# Patient Record
Sex: Female | Born: 1980 | Race: White | Hispanic: No | Marital: Married | Smoking: Never smoker
Health system: Southern US, Community
[De-identification: ages and names within clinical notes are randomized; demographics above are authoritative.]

## PROBLEM LIST (undated history)

## (undated) DIAGNOSIS — E079 Disorder of thyroid, unspecified: Secondary | ICD-10-CM

## (undated) DIAGNOSIS — N63 Unspecified lump in unspecified breast: Secondary | ICD-10-CM

## (undated) DIAGNOSIS — F32A Depression, unspecified: Secondary | ICD-10-CM

## (undated) DIAGNOSIS — Z309 Encounter for contraceptive management, unspecified: Secondary | ICD-10-CM

## (undated) DIAGNOSIS — I10 Essential (primary) hypertension: Secondary | ICD-10-CM

## (undated) DIAGNOSIS — N644 Mastodynia: Secondary | ICD-10-CM

## (undated) DIAGNOSIS — F329 Major depressive disorder, single episode, unspecified: Secondary | ICD-10-CM

## (undated) HISTORY — DX: Unspecified lump in unspecified breast: N63.0

## (undated) HISTORY — DX: Depression, unspecified: F32.A

## (undated) HISTORY — DX: Mastodynia: N64.4

## (undated) HISTORY — DX: Essential (primary) hypertension: I10

## (undated) HISTORY — DX: Disorder of thyroid, unspecified: E07.9

## (undated) HISTORY — DX: Encounter for contraceptive management, unspecified: Z30.9

## (undated) HISTORY — DX: Major depressive disorder, single episode, unspecified: F32.9

---
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
## 2003-02-11 ENCOUNTER — Emergency Department (HOSPITAL_COMMUNITY): Admission: EM | Admit: 2003-02-11 | Discharge: 2003-02-11 | Payer: Self-pay | Admitting: Emergency Medicine

## 2003-07-12 ENCOUNTER — Emergency Department (HOSPITAL_COMMUNITY): Admission: EM | Admit: 2003-07-12 | Discharge: 2003-07-13 | Payer: Self-pay | Admitting: Emergency Medicine

## 2003-09-25 ENCOUNTER — Emergency Department (HOSPITAL_COMMUNITY): Admission: EM | Admit: 2003-09-25 | Discharge: 2003-09-25 | Payer: Self-pay | Admitting: Emergency Medicine

## 2007-04-13 ENCOUNTER — Emergency Department (HOSPITAL_COMMUNITY): Admission: EM | Admit: 2007-04-13 | Discharge: 2007-04-13 | Payer: Self-pay | Admitting: Emergency Medicine

## 2007-04-17 ENCOUNTER — Ambulatory Visit: Payer: Self-pay | Admitting: Gastroenterology

## 2010-06-01 NOTE — Assessment & Plan Note (Signed)
Deborah Vaughn, [name]                CHART#:  [id number]   DATE:  04/17/2007                       DOB:  [birth year]   REFERRING PHYSICIAN:  Donnetta Hutching, MD   REASON FOR CONSULTATION:  Rectal bleeding.   HISTORY OF PRESENT ILLNESS:  [name] is a 30 year old female who has  difficulty having a bowel movement.  She may have a bowel movement once  every three days and always has to strain.  Her last menstrual period  was approximately three weeks ago.  She was seen in the emergency  department on April 13, 2007 for rectal bleeding. The physician  reportedly told her she had a mass and needed to see a  gastroenterologist.  He also gave her some Anusol HC suppositories, #20  and asked her to use one twice daily until they are complete.  She says  this all began when her stools became hard and large.  She used stool  softeners as well as fiber pills. She has had an intentional 30 pound  weight loss.  Every time she has a bowel movement, she sees tons of  blood and it is very painful.  It is a ripping tearing pain that she  feels in her rectum and she does not really want to have a bowel  movement.  She has had some lower abdominal discomfort associated with  these symptoms but denies any diarrhea.  The stool softeners or the  suppositories make her go to the bathroom more frequently.  She usually  has a hard to soft stool.  She has had no sores in her mouth, no pain  with swallowing or any rashes on her shins.  Her heartburn and her  indigestion area treated with Zantac.  She has those symptoms every few  days.   PAST MEDICAL HISTORY:  Hypertension.   PAST SURGICAL HISTORY:  None.   ALLERGIES:  VICODIN causes throat swelling and itching.   MEDICATIONS:  1. Lo-Ovral 28 daily for the last six years.  2. Lisinopril/HCTZ 20/12.5 mg daily.  3. Hydrocortisone AC 25 mg suppositories rectally bid since Friday or      Saturday.   FAMILY HISTORY:  Her father had colon cancer at age 65.  She  has no  family history of ulcerative colitis or Crohn's disease.  She has one  sister that is healthy.   SOCIAL HISTORY:  She is not married.  She works as a Engineer, mining in  [place].  She exercises every day on the elliptical.  Sometimes she  does the core ball.  She does not smoke or drink alcohol.   REVIEW OF SYSTEMS:  As per the HPI.  Otherwise all systems are negative.   PHYSICAL EXAMINATION:  VITAL SIGNS:  Weight 217 pounds, height 5 feet 4  inches, temperature 98.2, blood pressure 110/74, pulse 72.  GENERAL:  She is in no acute distress, alert and oriented x4.  HEENT:  Atraumatic, normocephalic.  Pupils equal and reactive to light. Mouth:  No oral lesions. Posterior pharynx without erythema or exudate.  NECK:  Full range of motion, no lymphadenopathy.LUNGS:  Clear to auscultation  bilaterally.CARDIOVASCULAR:  Regular rhythm, no murmur.  ABDOMEN:  Bowel  sounds are present, soft, nontender, nondistended, no rebound or  guarding.EXTREMITIES:  Without cyanosis or edema.  NEUROLOGIC:  She has no  focal neurologic deficits.  SKIN:  She has no  pretibial lesions.  RECTAL:  Digital exam extremely uncomfortable, pain  elicited with stressing the anal canal, small internal hemorrhoid on the  right at approximately 5 o'clock.  Limited rectal exam due to  discomfort, hemoccult positive (slightly).   ASSESSMENT:  [name] is a 30 year old female who has a first degree  relative with history of colon cancer.  Her symptoms sound typical for  anal fissure.  Thank you for allowing me to see Ms. Barnes in  consultation.  My recommendations follow.   RECOMMENDATIONS:  1. [name] declines colonoscopy at this point.  She would like to      try conservative management for her symptoms.  She is given her      discharge instructions in writing.  2. She is to avoid straining with bowel movements and avoid formed      stools for the next month.  She will add docusate sodium 100 mg       tablets three times daily.  If her stools still have form to them,      then she is to add Miralax once or twice a day.  3. She is to follow a low residue diet.  She is given a hand out on      low residue diet.  She is to drink six to eight cups of water      daily.  4. She should then complete the Anusol twice daily for 10 days.  If      she is still having rectal discomfort and bleeding, then she may      repeat another course, complete a 20-day therapy.  5. For additional relief from rectal discomfort, she may use glycerin      suppositories prior to having a bowel movement or she may use sitz      baths for 10 to 15 minutes after each bowel movement to relieve      rectal pain.  She also may try Tylenol, Aleve or ibuprofen.  6. She has follow-up appointment to see me in four weeks.  7. Will repeat a rectal exam when she returns in four weeks to have a      more accurate digital exam.       Kassie Mends, M.D.  Electronically Signed     SM/MEDQ  D:  04/17/2007  T:  04/17/2007  Job:  [id number]   cc:   Donnetta Hutching, MD

## 2014-04-28 ENCOUNTER — Ambulatory Visit: Payer: 59

## 2014-05-19 ENCOUNTER — Encounter: Payer: Self-pay | Admitting: Adult Health

## 2014-05-19 ENCOUNTER — Other Ambulatory Visit (HOSPITAL_COMMUNITY)
Admission: RE | Admit: 2014-05-19 | Discharge: 2014-05-19 | Disposition: A | Discharge status: Home / Self Care | Payer: Self-pay | Source: Ambulatory Visit | Attending: Adult Health | Admitting: Adult Health

## 2014-05-19 ENCOUNTER — Ambulatory Visit (INDEPENDENT_AMBULATORY_CARE_PROVIDER_SITE_OTHER): Payer: 59 | Admitting: Adult Health

## 2014-05-19 VITALS — BP 138/90 | HR 92 | Ht 63.5 in | Wt 182.5 lb

## 2014-05-19 DIAGNOSIS — Z30011 Encounter for initial prescription of contraceptive pills: Secondary | ICD-10-CM

## 2014-05-19 DIAGNOSIS — Z01419 Encounter for gynecological examination (general) (routine) without abnormal findings: Secondary | ICD-10-CM

## 2014-05-19 DIAGNOSIS — Z309 Encounter for contraceptive management, unspecified: Secondary | ICD-10-CM

## 2014-05-19 DIAGNOSIS — N644 Mastodynia: Secondary | ICD-10-CM

## 2014-05-19 DIAGNOSIS — N63 Unspecified lump in unspecified breast: Secondary | ICD-10-CM

## 2014-05-19 DIAGNOSIS — Z1151 Encounter for screening for human papillomavirus (HPV): Secondary | ICD-10-CM | POA: Insufficient documentation

## 2014-05-19 HISTORY — DX: Mastodynia: N64.4

## 2014-05-19 HISTORY — DX: Unspecified lump in unspecified breast: N63.0

## 2014-05-19 HISTORY — DX: Encounter for contraceptive management, unspecified: Z30.9

## 2014-05-19 MED ORDER — NORETHIN-ETH ESTRAD TRIPHASIC 0.5/0.75/1-35 MG-MCG PO TABS: 1.0000 | ORAL_TABLET | Freq: Every day | ORAL | Status: AC

## 2014-05-19 NOTE — Patient Instructions (Signed)
Breast Cyst A breast cyst is a sac in the breast that is filled with fluid. Breast cysts are common in women. Women can have one or many cysts. When the breasts contain many cysts, it is usually due to a noncancerous (benign) condition called fibrocystic change. These lumps form under the influence of female hormones (estrogen and progesterone). The lumps are most often located in the upper, outer portion of the breast. They are often more swollen, painful, and tender before your period starts. They usually disappear after menopause, unless you are on hormone therapy.  There are several types of cysts:  Macrocyst. This is a cyst that is about 2 in. (5.1 cm) in diameter.   Microcyst. This is a tiny cyst that you cannot feel but can be seen with a mammogram or an ultrasound.   Galactocele. This is a cyst containing milk that may develop if you suddenly stop breastfeeding.   Sebaceous cyst of the skin. This type of cyst is not in the breast tissue itself. Breast cysts do not increase your risk of breast cancer. However, they must be monitored closely because they can be cancerous.  CAUSES  It is not known exactly what causes a breast cyst to form. Possible causes include:  An overgrowth of milk glands and connective tissue in the breast can block the milk glands, causing them to fill with fluid.   Scar tissue in the breast from previous surgery may block the glands, causing a cyst.  RISK FACTORS Estrogen may influence the development of a breast cyst.  SIGNS AND SYMPTOMS   Feeling a smooth, round, soft lump (like a grape) in the breast that is easily moveable.   Breast discomfort or pain.  Increase in size of the lump before your menstrual period and decrease in its size after your menstrual period.  DIAGNOSIS  A cyst can be felt during a physical exam by your health care provider. A breast X-ray exam (mammogram) and ultrasonography will be done to confirm the diagnosis. Fluid may  be removed from the cyst with a needle (fine needle aspiration) to make sure the cyst is not cancerous.  TREATMENT  Treatment may not be necessary. Your health care provider may monitor the cyst to see if it goes away on its own. If treatment is needed, it may include:  Hormone treatment.   Needle aspiration. There is a chance of the cyst coming back after aspiration.   Surgery to remove the whole cyst.  HOME CARE INSTRUCTIONS   Keep all follow-up appointments with your health care provider.  See your health care provider regularly:  Get a yearly exam by your health care provider.  Have a clinical breast exam by a health care provider every 1-3 years if you are 20-40 years of age. After age 40 years, you should have the exam every year.   Get mammogram tests as directed by your health care provider.   Understand the normal appearance and feel of your breasts and perform breast self-exams.   Only take over-the-counter or prescription medicines as directed by your health care provider.   Wear a supportive bra, especially when exercising.   Avoid caffeine.   Reduce your salt intake, especially before your menstrual period. Too much salt can cause fluid retention, breast swelling, and discomfort.  SEEK MEDICAL CARE IF:   You feel, or think you feel, a lump in your breast.   You notice that both breasts look or feel different than usual.   Your   breast is still causing pain after your menstrual period is over.   You need medicine for breast pain and swelling that occurs with your menstrual period.  SEEK IMMEDIATE MEDICAL CARE IF:   You have severe pain, tenderness, redness, or warmth in your breast.   You have nipple discharge or bleeding.   Your breast lump becomes hard and painful.   You find new lumps or bumps that were not there before.   You feel lumps in your armpit (axilla).   You notice dimpling or wrinkling of the breast or nipple.   You  have a fever.  MAKE SURE YOU:  Understand these instructions.  Will watch your condition.  Will get help right away if you are not doing well or get worse. Document Released: 01/03/2005 Document Revised: 09/05/2012 Document Reviewed: 08/02/2012 [facility] Patient Information 2015 [employer], [place]. This information is not intended to replace advice given to you by your health care provider. Make sure you discuss any questions you have with your health care provider. Physical in 1 year Mammogram 5/24 at 1:45 pm be there at 1:30  Pap in 3 years if this normal with negative HPV

## 2014-05-19 NOTE — Progress Notes (Signed)
Patient ID: [name], female   DOB: 08/19/1980, 34 y.o.   MRN: [id number] History of Present Illness: Deborah Vaughn is a 34 year old white female,single in for a well woman gyn exam and pap.She complains of pain in left nipple area for 2 months and has taken an antibiotic from PCP. Her PCP is Dr Polly CobiaHasanji.   Current Medications, Allergies, Past Medical History, Past Surgical History, Family History and Social History were [facility].     Review of Systems: Patient denies any headaches, hearing loss, blurred vision, shortness of breath, chest pain, abdominal pain, problems with bowel movements, urination, or intercourse. No joint pain or mood swings. +left breast pain.Has been tired lately. She is happy with her OCs and needs refill, she has lost about 95 lb about 5 years ago and has gained about 15 back so has restarted phentermine.   Physical Exam:BP 138/90 mmHg  Pulse 92  Ht 5' 3.5" (1.613 m)  Wt 182 lb 8 oz (82.781 kg)  BMI 31.82 kg/m2  LMP 05/08/2014 General:  Well developed, well nourished, no acute distress Skin:  Warm and dry Neck:  Midline trachea, normal thyroid, good ROM, no lymphadenopathy Lungs; Clear to auscultation bilaterally Breast:  No dominant palpable mass, retraction, or nipple discharge on right, on left no retraction or nipple discharge but has pain at nipple and there is a thickness/?nodule at 3 o'clock that is tender Cardiovascular: Regular rate and rhythm Abdomen:  Soft, non tender, no hepatosplenomegaly Pelvic:  External genitalia is normal in appearance, no lesions.  The vagina is normal in appearance. Urethra has no lesions or masses. The cervix is everted at os, pap with HPV performed.  Uterus is felt to be normal size, shape, and contour.  No adnexal masses or tenderness noted.Bladder is non tender, no masses felt. Extremities/musculoskeletal:  No swelling or varicosities noted, no clubbing or cyanosis Psych:  No mood  changes, alert and cooperative,seems happy   Impression: Well woman gyn exam with pap Left breast pain ?nodule left breast Contraceptive management    Plan: Check CBC,CMP,TSH and lipids Bilateral diagnostic [facility] pirmella [phone] x 1 year Physical in 1 year, pap in 3 years if normal with negative HPV Review hand out on breast cyst Do not wear under wire for a while,try sports bra

## 2014-05-20 ENCOUNTER — Telehealth: Payer: Self-pay | Admitting: Adult Health

## 2014-05-20 LAB — COMPREHENSIVE METABOLIC PANEL
ALBUMIN: 4.3 g/dL (ref 3.5–5.5)
ALK PHOS: 54 IU/L (ref 39–117)
ALT: 14 IU/L (ref 0–32)
AST: 16 IU/L (ref 0–40)
Albumin/Globulin Ratio: 2 (ref 1.1–2.5)
BUN / CREAT RATIO: 10 (ref 8–20)
BUN: 11 mg/dL (ref 6–20)
Bilirubin Total: 0.2 mg/dL (ref 0.0–1.2)
CALCIUM: 9.1 mg/dL (ref 8.7–10.2)
CO2: 21 mmol/L (ref 18–29)
CREATININE: 1.07 mg/dL — AB (ref 0.57–1.00)
Chloride: 102 mmol/L (ref 97–108)
GFR calc Af Amer: 79 mL/min/{1.73_m2} (ref >59)
GFR calc non Af Amer: 69 mL/min/{1.73_m2} (ref >59)
GLOBULIN, TOTAL: 2.2 g/dL (ref 1.5–4.5)
GLUCOSE: 103 mg/dL — AB (ref 65–99)
Potassium: 3.5 mmol/L (ref 3.5–5.2)
Sodium: 140 mmol/L (ref 134–144)
TOTAL PROTEIN: 6.5 g/dL (ref 6.0–8.5)

## 2014-05-20 LAB — CBC
Hematocrit: 39.8 % (ref 34.0–46.6)
Hemoglobin: 13.7 g/dL (ref 11.1–15.9)
MCH: 30.9 pg (ref 26.6–33.0)
MCHC: 34.4 g/dL (ref 31.5–35.7)
MCV: 90 fL (ref 79–97)
PLATELETS: 280 10*3/uL (ref 150–379)
RBC: 4.43 x10E6/uL (ref 3.77–5.28)
RDW: 13.1 % (ref 12.3–15.4)
WBC: 5.8 10*3/uL (ref 3.4–10.8)

## 2014-05-20 LAB — LIPID PANEL
CHOL/HDL RATIO: 2.5 ratio (ref 0.0–4.4)
Cholesterol, Total: 160 mg/dL (ref 100–199)
HDL: 65 mg/dL (ref >39)
LDL CALC: 79 mg/dL (ref 0–99)
Triglycerides: 78 mg/dL (ref 0–149)
VLDL Cholesterol Cal: 16 mg/dL (ref 5–40)

## 2014-05-20 LAB — TSH: TSH: 4.55 u[IU]/mL — ABNORMAL HIGH (ref 0.450–4.500)

## 2014-05-20 LAB — CYTOLOGY - PAP

## 2014-05-20 MED ORDER — LEVOTHYROXINE SODIUM 88 MCG PO TABS: 88.0000 ug | ORAL_TABLET | Freq: Every day | ORAL | Status: DC

## 2014-05-20 NOTE — Telephone Encounter (Signed)
Pt aware of labs will increase synthroid to 88 mcg and recheck labs in 3 months

## 2014-06-04 ENCOUNTER — Telehealth: Payer: Self-pay | Admitting: Adult Health

## 2014-06-04 NOTE — Telephone Encounter (Signed)
Spoke with pt letting her know pap was normal. JSY 

## 2014-06-10 ENCOUNTER — Encounter (HOSPITAL_COMMUNITY): Payer: Self-pay

## 2014-06-17 ENCOUNTER — Ambulatory Visit (HOSPITAL_COMMUNITY)
Admission: RE | Admit: 2014-06-17 | Discharge: 2014-06-17 | Disposition: A | Discharge status: Home / Self Care | Payer: 59 | Source: Ambulatory Visit | Attending: Adult Health | Admitting: Adult Health

## 2014-06-17 DIAGNOSIS — N63 Unspecified lump in unspecified breast: Secondary | ICD-10-CM

## 2014-06-17 DIAGNOSIS — N644 Mastodynia: Secondary | ICD-10-CM | POA: Insufficient documentation

## 2014-11-06 ENCOUNTER — Other Ambulatory Visit: Payer: Self-pay | Admitting: Adult Health

## 2015-02-19 ENCOUNTER — Encounter (HOSPITAL_COMMUNITY): Payer: Self-pay | Admitting: *Deleted

## 2015-02-19 ENCOUNTER — Emergency Department (HOSPITAL_COMMUNITY): Payer: 59

## 2015-02-19 ENCOUNTER — Emergency Department (HOSPITAL_COMMUNITY)
Admission: EM | Admit: 2015-02-19 | Discharge: 2015-02-20 | Disposition: A | Discharge status: Home / Self Care | Payer: 59 | Attending: Emergency Medicine | Admitting: Emergency Medicine

## 2015-02-19 DIAGNOSIS — Z793 Long term (current) use of hormonal contraceptives: Secondary | ICD-10-CM | POA: Insufficient documentation

## 2015-02-19 DIAGNOSIS — K5900 Constipation, unspecified: Secondary | ICD-10-CM | POA: Insufficient documentation

## 2015-02-19 DIAGNOSIS — R630 Anorexia: Secondary | ICD-10-CM | POA: Insufficient documentation

## 2015-02-19 DIAGNOSIS — R1031 Right lower quadrant pain: Secondary | ICD-10-CM

## 2015-02-19 DIAGNOSIS — I10 Essential (primary) hypertension: Secondary | ICD-10-CM | POA: Insufficient documentation

## 2015-02-19 DIAGNOSIS — Z3202 Encounter for pregnancy test, result negative: Secondary | ICD-10-CM | POA: Diagnosis not present

## 2015-02-19 DIAGNOSIS — E079 Disorder of thyroid, unspecified: Secondary | ICD-10-CM | POA: Diagnosis not present

## 2015-02-19 DIAGNOSIS — Z79899 Other long term (current) drug therapy: Secondary | ICD-10-CM | POA: Diagnosis not present

## 2015-02-19 DIAGNOSIS — F329 Major depressive disorder, single episode, unspecified: Secondary | ICD-10-CM | POA: Insufficient documentation

## 2015-02-19 DIAGNOSIS — R197 Diarrhea, unspecified: Secondary | ICD-10-CM | POA: Insufficient documentation

## 2015-02-19 DIAGNOSIS — Z8742 Personal history of other diseases of the female genital tract: Secondary | ICD-10-CM | POA: Diagnosis not present

## 2015-02-19 LAB — COMPREHENSIVE METABOLIC PANEL
ALT: 28 U/L (ref 14–54)
AST: 27 U/L (ref 15–41)
Albumin: 4.3 g/dL (ref 3.5–5.0)
Alkaline Phosphatase: 59 U/L (ref 38–126)
Anion gap: 15 (ref 5–15)
BILIRUBIN TOTAL: 0.4 mg/dL (ref 0.3–1.2)
BUN: 10 mg/dL (ref 6–20)
CO2: 21 mmol/L — ABNORMAL LOW (ref 22–32)
CREATININE: 0.97 mg/dL (ref 0.44–1.00)
Calcium: 9.7 mg/dL (ref 8.9–10.3)
Chloride: 100 mmol/L — ABNORMAL LOW (ref 101–111)
GFR calc Af Amer: >60 mL/min (ref >60)
Glucose, Bld: 78 mg/dL (ref 65–99)
POTASSIUM: 3.3 mmol/L — AB (ref 3.5–5.1)
Sodium: 136 mmol/L (ref 135–145)
TOTAL PROTEIN: 7.7 g/dL (ref 6.5–8.1)

## 2015-02-19 LAB — POC URINE PREG, ED: Preg Test, Ur: NEGATIVE

## 2015-02-19 LAB — URINALYSIS, ROUTINE W REFLEX MICROSCOPIC
Bilirubin Urine: NEGATIVE
Glucose, UA: NEGATIVE mg/dL
Hgb urine dipstick: NEGATIVE
KETONES UR: NEGATIVE mg/dL
LEUKOCYTES UA: NEGATIVE
NITRITE: NEGATIVE
PROTEIN: NEGATIVE mg/dL
Specific Gravity, Urine: 1.01 (ref 1.005–1.030)
pH: 6.5 (ref 5.0–8.0)

## 2015-02-19 LAB — CBC
HCT: 43.6 % (ref 36.0–46.0)
Hemoglobin: 15 g/dL (ref 12.0–15.0)
MCH: 31.1 pg (ref 26.0–34.0)
MCHC: 34.4 g/dL (ref 30.0–36.0)
MCV: 90.5 fL (ref 78.0–100.0)
PLATELETS: 284 10*3/uL (ref 150–400)
RBC: 4.82 MIL/uL (ref 3.87–5.11)
RDW: 12.5 % (ref 11.5–15.5)
WBC: 9.4 10*3/uL (ref 4.0–10.5)

## 2015-02-19 LAB — LIPASE, BLOOD: Lipase: 26 U/L (ref 11–51)

## 2015-02-19 MED ORDER — IOHEXOL 300 MG/ML  SOLN
100.0000 mL | Freq: Once | INTRAMUSCULAR | Status: AC | PRN
  Administered 2015-02-19: 100 mL via INTRAVENOUS

## 2015-02-19 MED ORDER — POLYETHYLENE GLYCOL 3350 17 G PO PACK: 17.0000 g | PACK | Freq: Every day | ORAL | Status: DC

## 2015-02-19 MED ORDER — MORPHINE SULFATE (PF) 4 MG/ML IV SOLN
4.0000 mg | Freq: Once | INTRAVENOUS | Status: AC
  Administered 2015-02-19: 4 mg via INTRAVENOUS
  Filled 2015-02-19: qty 1

## 2015-02-19 NOTE — Discharge Instructions (Signed)
Abdominal Pain, Adult °Many things can cause abdominal pain. Usually, abdominal pain is not caused by a disease and will improve without treatment. It can often be observed and treated at home. Your health care provider will do a physical exam and possibly order blood tests and X-rays to help determine the seriousness of your pain. However, in many cases, more time must pass before a clear cause of the pain can be found. Before that point, your health care provider may not know if you need more testing or further treatment. °HOME CARE INSTRUCTIONS °Monitor your abdominal pain for any changes. The following actions may help to alleviate any discomfort you are experiencing: °· Only take over-the-counter or prescription medicines as directed by your health care provider. °· Do not take laxatives unless directed to do so by your health care provider. °· Try a clear liquid diet (broth, tea, or water) as directed by your health care provider. Slowly move to a bland diet as tolerated. °SEEK MEDICAL CARE IF: °· You have unexplained abdominal pain. °· You have abdominal pain associated with nausea or diarrhea. °· You have pain when you urinate or have a bowel movement. °· You experience abdominal pain that wakes you in the night. °· You have abdominal pain that is worsened or improved by eating food. °· You have abdominal pain that is worsened with eating fatty foods. °· You have a fever. °SEEK IMMEDIATE MEDICAL CARE IF: °· Your pain does not go away within 2 hours. °· You keep throwing up (vomiting). °· Your pain is felt only in portions of the abdomen, such as the right side or the left lower portion of the abdomen. °· You pass bloody or black tarry stools. °MAKE SURE YOU: °· Understand these instructions. °· Will watch your condition. °· Will get help right away if you are not doing well or get worse. °  °This information is not intended to replace advice given to you by your health care provider. Make sure you discuss  any questions you have with your health care provider. °  °Document Released: 10/13/2004 Document Revised: 09/24/2014 Document Reviewed: 09/12/2012 °Elsevier Interactive Patient Education ©2016 [employer]. ° ° ° °High-Fiber Diet °Fiber, also called dietary fiber, is a type of carbohydrate found in fruits, vegetables, whole grains, and beans. A high-fiber diet can have many health benefits. Your health care provider may recommend a high-fiber diet to help: °· Prevent constipation. Fiber can make your bowel movements more regular. °· Lower your cholesterol. °· Relieve hemorrhoids, uncomplicated diverticulosis, or irritable bowel syndrome. °· Prevent overeating as part of a weight-loss plan. °· Prevent heart disease, type 2 diabetes, and certain cancers. °WHAT IS MY PLAN? °The recommended daily intake of fiber includes: °· 38 grams for men under age 50. °· 30 grams for men over age 50. °· 25 grams for women under age 50. °· 21 grams for women over age 50. °You can get the recommended daily intake of dietary fiber by eating a variety of fruits, vegetables, grains, and beans. Your health care provider may also recommend a fiber supplement if it is not possible to get enough fiber through your diet. °WHAT DO I NEED TO KNOW ABOUT A HIGH-FIBER DIET? °· Fiber supplements have not been widely studied for their effectiveness, so it is better to get fiber through food sources. °· Always check the fiber content on the nutrition facts label of any prepackaged food. Look for foods that contain at least 5 grams of fiber per serving. °·   Ask your dietitian if you have questions about specific foods that are related to your condition, especially if those foods are not listed in the following section. °· Increase your daily fiber consumption gradually. Increasing your intake of dietary fiber too quickly may cause bloating, cramping, or gas. °· Drink plenty of water. Water helps you to digest fiber. °WHAT FOODS CAN I  EAT? °Grains °Whole-grain breads. Multigrain cereal. Oats and oatmeal. Brown rice. Barley. Bulgur wheat. Millet. Bran muffins. Popcorn. Rye wafer crackers. °Vegetables °Sweet potatoes. Spinach. Kale. Artichokes. Cabbage. Broccoli. Green peas. Carrots. Squash. °Fruits °Berries. Pears. Apples. Oranges. Avocados. Prunes and raisins. Dried figs. °Meats and Other Protein Sources °Navy, kidney, pinto, and soy beans. Split peas. Lentils. Nuts and seeds. °Dairy °Fiber-fortified yogurt. °Beverages °Fiber-fortified soy milk. Fiber-fortified orange juice. °Other °Fiber bars. °The items listed above may not be a complete list of recommended foods or beverages. Contact your dietitian for more options. °WHAT FOODS ARE NOT RECOMMENDED? °Grains °White bread. Pasta made with refined flour. White rice. °Vegetables °Fried potatoes. Canned vegetables. Well-cooked vegetables.  °Fruits °Fruit juice. Cooked, strained fruit. °Meats and Other Protein Sources °Fatty cuts of meat. Fried poultry or fried fish. °Dairy °Milk. Yogurt. Cream cheese. Sour cream. °Beverages °Soft drinks. °Other °Cakes and pastries. Butter and oils. °The items listed above may not be a complete list of foods and beverages to avoid. Contact your dietitian for more information. °WHAT ARE SOME TIPS FOR INCLUDING HIGH-FIBER FOODS IN MY DIET? °· Eat a wide variety of high-fiber foods. °· Make sure that half of all grains consumed each day are whole grains. °· Replace breads and cereals made from refined flour or white flour with whole-grain breads and cereals. °· Replace white rice with brown rice, bulgur wheat, or millet. °· Start the day with a breakfast that is high in fiber, such as a cereal that contains at least 5 grams of fiber per serving. °· Use beans in place of meat in soups, salads, or pasta. °· Eat high-fiber snacks, such as berries, raw vegetables, nuts, or popcorn. °  °This information is not intended to replace advice given to you by your health care  provider. Make sure you discuss any questions you have with your health care provider. °  °Document Released: 01/03/2005 Document Revised: 01/24/2014 Document Reviewed: 06/18/2013 °Elsevier Interactive Patient Education ©2016 [employer] Inc. ° °

## 2015-02-19 NOTE — ED Provider Notes (Signed)
CSN: [id number]     Arrival date & time 02/19/15  1723 History   First MD Initiated Contact with Patient 02/19/15 2001     Chief Complaint  Patient presents with  . Abdominal Pain     (Consider location/radiation/quality/duration/timing/severity/associated sxs/prior Treatment) Patient is a 35 y.o. female presenting with abdominal pain. The history is provided by the patient. No language interpreter was used.  Abdominal Pain Pain location:  RLQ Pain quality: aching and cramping   Pain severity:  Moderate Onset quality:  Gradual Duration:  1 day Timing:  Constant Progression:  Worsening Associated symptoms: diarrhea   Associated symptoms: no chills, no cough, no dysuria, no fever, no nausea, no shortness of breath, no vaginal discharge and no vomiting   Associated symptoms comment:  Patient with history of HTN presents with complaint of right sided abdominal pain since yesterday. She reports aching, intermittent pain since yesterday associated with non-bloody diarrhea described as infrequent loose stool. Last night the pain woke her from sleep, became sharp, more intense and more constant. She reports a complete loss of appetite today, without nausea, vomiting or fever. No urinary symptoms. No vaginal discharge.    Past Medical History  Diagnosis Date  . Hypertension   . Depression   . Thyroid disease   . Breast pain, left 05/19/2014  . Breast nodule 05/19/2014  . Contraceptive management 05/19/2014   History reviewed. No pertinent past surgical history. Family History  Problem Relation Age of Onset  . Hypertension Mother   . Hypertension Father   . Diabetes Father   . Cancer Father     colon  . Hypertension Sister   . Hypertension Maternal Grandmother   . Hypertension Maternal Grandfather   . Heart disease Paternal Grandmother   . Diabetes Paternal Grandmother   . Heart disease Paternal Grandfather   . Diabetes Paternal Grandfather    Social History  Substance Use Topics  .  Smoking status: Never Smoker   . Smokeless tobacco: Never Used  . Alcohol Use: No   OB History    Gravida Para Term Preterm AB TAB SAB Ectopic Multiple Living       Review of Systems  Constitutional: Positive for appetite change. Negative for fever and chills.  Respiratory: Negative.  Negative for cough and shortness of breath.   Cardiovascular: Negative.   Gastrointestinal: Positive for abdominal pain and diarrhea. Negative for nausea and vomiting.  Genitourinary: Negative for dysuria, flank pain, vaginal discharge and pelvic pain.  Musculoskeletal: Negative.  Negative for myalgias.  Neurological: Negative.       Allergies  Codeine  Home Medications   Prior to Admission medications   Medication Sig Start Date End Date Taking? Authorizing Provider  acyclovir (ZOVIRAX) 400 MG tablet 400 mg as needed.  04/21/14  Yes Historical Provider, MD  benazepril (LOTENSIN) 20 MG tablet Take 1 tablet by mouth daily. 01/31/15  Yes Historical Provider, MD  buPROPion (WELLBUTRIN XL) 300 MG 24 hr tablet 300 mg daily.  04/21/14  Yes Historical Provider, MD  hydrochlorothiazide (HYDRODIURIL) 25 MG tablet Take 25 mg by mouth daily.   Yes Historical Provider, MD  levothyroxine (SYNTHROID, LEVOTHROID) 88 MCG tablet take 1 tablet by mouth once daily before BREAKFAST 11/06/14  Yes Adline Potter, NP  norethindrone-ethinyl estradiol (PIRMELLA 7/7/7) 0.5/0.75/1-35 MG-MCG tablet Take 1 tablet by mouth daily. 05/19/14  Yes Adline Potter, NP  BENAZEPRIL-HYDROCHLOROTHIAZIDE PO Take 20 mg by mouth daily.  Historical Provider, MD  phentermine 37.5 MG capsule Take 37.5 mg by mouth daily.    Historical Provider, MD   BP 131/72 mmHg  Pulse 100  Temp(Src) 98.1 F (36.7 C) (Oral)  Resp 18  Ht  (1.626 m)  Wt 81.647 kg  BMI 30.88 kg/m2  SpO2 100%  LMP 02/12/2015 Physical Exam  Constitutional: She is oriented to person, place, and time. She appears well-developed and  well-nourished.  HENT:  Head: Normocephalic.  Neck: Normal range of motion. Neck supple.  Cardiovascular: Normal rate.   Pulmonary/Chest: Effort normal.  Abdominal: Soft. Bowel sounds are normal. There is tenderness. There is no rebound and no guarding.  Tender over McBurney's point RLQ without guarding. Abdomen soft. BS hypoactive.  Musculoskeletal: Normal range of motion.  Neurological: She is alert and oriented to person, place, and time.  Skin: Skin is warm and dry. No rash noted.  Psychiatric: She has a normal mood and affect.    ED Course  Procedures (including critical care time) Labs Review Labs Reviewed  COMPREHENSIVE METABOLIC PANEL - Abnormal; Notable for the following:    Potassium 3.3 (*)    Chloride 100 (*)    CO2 21 (*)    All other components within normal limits  LIPASE, BLOOD  CBC  URINALYSIS, ROUTINE W REFLEX MICROSCOPIC (NOT AT [facility])  POC URINE PREG, ED   Results for orders placed or performed during the hospital encounter of 02/19/15  Lipase, blood  Result Value Ref Range   Lipase 26 11 - 51 U/L  Comprehensive metabolic panel  Result Value Ref Range   Sodium 136 135 - 145 mmol/L   Potassium 3.3 (L) 3.5 - 5.1 mmol/L   Chloride 100 (L) 101 - 111 mmol/L   CO2 21 (L) 22 - 32 mmol/L   Glucose, Bld 78 65 - 99 mg/dL   BUN 10 6 - 20 mg/dL   Creatinine, Ser 0.86 0.44 - 1.00 mg/dL   Calcium 9.7 8.9 - 57.8 mg/dL   Total Protein 7.7 6.5 - 8.1 g/dL   Albumin 4.3 3.5 - 5.0 g/dL   AST 27 15 - 41 U/L   ALT 28 14 - 54 U/L   Alkaline Phosphatase 59 38 - 126 U/L   Total Bilirubin 0.4 0.3 - 1.2 mg/dL   GFR calc non Af Amer >60 >60 mL/min   GFR calc Af Amer >60 >60 mL/min   Anion gap 15 5 - 15  CBC  Result Value Ref Range   WBC 9.4 4.0 - 10.5 K/uL   RBC 4.82 3.87 - 5.11 MIL/uL   Hemoglobin 15.0 12.0 - 15.0 g/dL   HCT 46.9 62.9 - 52.8 %   MCV 90.5 78.0 - 100.0 fL   MCH 31.1 26.0 - 34.0 pg   MCHC 34.4 30.0 - 36.0 g/dL   RDW 41.3 24.4 - 01.0 %   Platelets 284  150 - 400 K/uL  Urinalysis, Routine w reflex microscopic (not at [facility])  Result Value Ref Range   Color, Urine YELLOW YELLOW   APPearance CLEAR CLEAR   Specific Gravity, Urine 1.010 1.005 - 1.030   pH 6.5 5.0 - 8.0   Glucose, UA NEGATIVE NEGATIVE mg/dL   Hgb urine dipstick NEGATIVE NEGATIVE   Bilirubin Urine NEGATIVE NEGATIVE   Ketones, ur NEGATIVE NEGATIVE mg/dL   Protein, ur NEGATIVE NEGATIVE mg/dL   Nitrite NEGATIVE NEGATIVE   Leukocytes, UA NEGATIVE NEGATIVE  POC urine preg, ED (not at [facility])  Result Value Ref Range  Preg Test, Ur NEGATIVE NEGATIVE   Ct Abdomen Pelvis W Contrast  02/19/2015  CLINICAL DATA:  Right lower quadrant abdominal pain. Pain and diarrhea, onset last night. EXAM: CT ABDOMEN AND PELVIS WITH CONTRAST TECHNIQUE: Multidetector CT imaging of the abdomen and pelvis was performed using the standard protocol following bolus administration of intravenous contrast. CONTRAST:  OMNIPAQUE IOHEXOL 300 MG/ML  SOLN COMPARISON:  CT 06/04/2013 FINDINGS: Lower chest:  The included lung bases are clear. Liver: No focal lesion. Hepatobiliary: Gallbladder physiologically distended, no calcified stone. No biliary dilatation. Pancreas: No ductal dilatation or inflammation. Spleen: Normal.  Splenule noted at the hilum. Adrenal glands: No nodule. Kidneys: Symmetric renal enhancement. No hydronephrosis. Simple cyst in the interpolar left kidney is unchanged, with minimal adjacent cortical scarring. Stomach/Bowel: Stomach physiologically distended. There are no dilated or thickened small bowel loops. Small volume of stool throughout the colon without colonic wall thickening. Left colon is tortuous with mild gaseous distention in the upper abdomen, no associated wall thickening or colonic inflammation. Portions of the appendix are normal where visualized, no inflammatory change in the right lower quadrant. Vascular/Lymphatic: No retroperitoneal adenopathy. Abdominal aorta is normal in caliber.  Reproductive: Normal appearance of the uterus. Symmetric ovarian size. Minimal pelvic free fluid is physiologic. Bladder: Physiologically distended, no wall thickening. Other: No free air, ascites, or intra-abdominal fluid collection. Musculoskeletal: There are no acute or suspicious osseous abnormalities. IMPRESSION: No acute abnormality in the abdomen/pelvis. No evidence of appendicitis. Colon is tortuous in its course with mild gaseous distention in the upper abdomen, no associated colonic inflammation. Electronically Signed   By: Rubye Oaks M.D.   On: 02/19/2015 23:10    Imaging Review No results found. I have personally reviewed and evaluated these images and lab results as part of my medical decision-making.   EKG Interpretation None      MDM   Final diagnoses:  None    1. Abdominal pain  DDx: appendicitis given tenderness in RLQ, anorexia, loose stool. Plan: CT r/o appy.  CT scan shows normal appendix. Stool throughout colon - no inflammation. Symmetric ovaries. Patient has a history of ovarian cysts and pelvic exam was offered. She declined stating current pain dissimilar, in conjunction with normal appearance of ovaries on CT, she did not feel it was necessary.   She states pain is improved. VSS. Labs reassuring. Will recommend Miralax for symptomatic relief, PCP follow up for persistent symptoms and return to ED with worsening symptoms.    Elpidio Anis, PA-C 02/19/15 2355  Zadie Rhine, MD 02/20/15 [phone]

## 2015-02-19 NOTE — ED Notes (Signed)
Pt c/o right sided abd pain since 0400. States it is tender to the touch.

## 2015-02-20 NOTE — ED Notes (Signed)
Pt left with all her belongings and ambulated out of the treatment area.  

## 2015-06-08 ENCOUNTER — Other Ambulatory Visit: Payer: Self-pay | Admitting: Adult Health

## 2015-09-03 ENCOUNTER — Ambulatory Visit: Payer: Self-pay

## 2015-09-25 ENCOUNTER — Ambulatory Visit (INDEPENDENT_AMBULATORY_CARE_PROVIDER_SITE_OTHER): Payer: 59 | Admitting: Adult Health

## 2015-09-25 ENCOUNTER — Encounter: Payer: Self-pay | Admitting: Adult Health

## 2015-09-25 VITALS — BP 122/70 | HR 92 | Ht 64.0 in | Wt 185.5 lb

## 2015-09-25 DIAGNOSIS — R3915 Urgency of urination: Secondary | ICD-10-CM | POA: Diagnosis not present

## 2015-09-25 DIAGNOSIS — E039 Hypothyroidism, unspecified: Secondary | ICD-10-CM | POA: Diagnosis not present

## 2015-09-25 DIAGNOSIS — Z01411 Encounter for gynecological examination (general) (routine) with abnormal findings: Secondary | ICD-10-CM | POA: Diagnosis not present

## 2015-09-25 DIAGNOSIS — Z01419 Encounter for gynecological examination (general) (routine) without abnormal findings: Secondary | ICD-10-CM

## 2015-09-25 DIAGNOSIS — N921 Excessive and frequent menstruation with irregular cycle: Secondary | ICD-10-CM

## 2015-09-25 DIAGNOSIS — N941 Unspecified dyspareunia: Secondary | ICD-10-CM | POA: Diagnosis not present

## 2015-09-25 MED ORDER — URIBEL 118 MG PO CAPS: ORAL_CAPSULE | ORAL | 0 refills | Status: DC

## 2015-09-25 NOTE — Patient Instructions (Signed)
Change positions with sex Push water Decrease caffeine  Take uribel and follow up in 4 weeks  google IC

## 2015-09-25 NOTE — Progress Notes (Signed)
Patient ID: Deborah SaleMelody L Barnes, female   DOB: 11/13/1980, 35 y.o.   MRN: [id number] History of Present Illness: Deborah Vaughn is a 35 year old white female, married in for a well woman gyn exam, she had a normal pap with negative HPV 05/19/14.She says she has been treated like 6 times this year for ?UTI and culture was finally done and was negative.She says she feels like she needs to pee, there is an urgency.She also has had BTB and pain with sex.  PCP is Dr Alvina FilbertHasjani.  Current Medications, Allergies, Past Medical History, Past Surgical History, Family History and Social History were reviewed in [facility] electronic medical record.     Review of Systems: Patient denies any headaches, hearing loss, fatigue, blurred vision, shortness of breath, chest pain, abdominal pain, problems with bowel movements, No joint pain or mood swings. See HPI for positives.   Physical Exam:BP 122/70 (BP Location: Left Arm, Patient Position: Sitting, Cuff Size: Normal)   Pulse 92   Ht 5\' 4"  (1.626 m)   Wt 185 lb 8 oz (84.1 kg)   LMP 09/23/2015 (Exact Date)   BMI 31.84 kg/m  General:  Well developed, well nourished, no acute distress Skin:  Warm and dry Neck:  Midline trachea, normal thyroid, good ROM, no lymphadenopathy Lungs; Clear to auscultation bilaterally Breast:  No dominant palpable mass, retraction, or nipple discharge Cardiovascular: Regular rate and rhythm Abdomen:  Soft, non tender, no hepatosplenomegaly Pelvic:  External genitalia is normal in appearance, no lesions.  The vagina is normal in appearance. Urethra has no lesions or masses.Mild tenderness on palpation. The cervix is smooth.  Uterus is felt to be normal size, shape, and contour.  No adnexal masses or tenderness noted.Bladder is non tender, no masses felt. Extremities/musculoskeletal:  No swelling or varicosities noted, no clubbing or cyanosis Psych:  No mood changes, alert and cooperative,seems happy Discussed she may IC, will try uribel to see  if helps.  Impression: 1. Well woman exam with routine gynecological exam   2. Dyspareunia in female   3. Irregular intermenstrual bleeding   4. Urinary urgency   5. Hypothyroidism, unspecified hypothyroidism type       Plan: Check TSH Meds ordered this encounter  Medications  . Meth-Hyo-M Bl-Na Phos-Ph Sal (URIBEL) 118 MG CAPS    Sig: Take 1 tid    Dispense:  100 capsule    Refill:  0    Order Specific Question:   Supervising Provider    Answer:   Lazaro ArmsEURE, LUTHER H [[id number]]   Change positions with sex Push water Decrease caffeine Google IC  Follow up in 4 weeks  Physical in 1 year, pap in 2019

## 2015-09-26 LAB — TSH: TSH: 2.58 u[IU]/mL (ref 0.450–4.500)

## 2015-09-28 ENCOUNTER — Telehealth: Payer: Self-pay | Admitting: Adult Health

## 2015-09-28 MED ORDER — LEVOTHYROXINE SODIUM 88 MCG PO TABS: ORAL_TABLET | ORAL | 12 refills | Status: DC

## 2015-09-28 NOTE — Telephone Encounter (Signed)
Left message that TSH normal will refill synthroid x 1 year

## 2015-10-02 ENCOUNTER — Telehealth: Payer: Self-pay | Admitting: Adult Health

## 2015-10-02 NOTE — Telephone Encounter (Signed)
Spoke with pt. Pt was put on Uribel last Friday. She is taking it TID. Pt is having hives and retaining fluid. Also has a constant headache and urinary frequency. Please advise. Thanks!! JSY

## 2015-10-02 NOTE — Telephone Encounter (Signed)
She complains of frequent urination and hives, on been on uribel for 1 week, stop uribel and take benadryl, call me Monday for update, if gets worse go to ER

## 2015-10-23 ENCOUNTER — Ambulatory Visit: Payer: 59 | Admitting: Adult Health

## 2015-11-19 ENCOUNTER — Telehealth: Payer: Self-pay | Admitting: Adult Health

## 2015-11-19 NOTE — Telephone Encounter (Signed)
Left message advising pt to call back and schedule an appt to be seen for possible UTI. JSY

## 2016-06-24 ENCOUNTER — Encounter: Payer: Self-pay | Admitting: Adult Health

## 2016-09-26 ENCOUNTER — Other Ambulatory Visit: Payer: 59 | Admitting: Adult Health

## 2019-07-04 ENCOUNTER — Other Ambulatory Visit: Payer: Self-pay

## 2019-07-04 ENCOUNTER — Emergency Department
Admission: RE | Admit: 2019-07-04 | Discharge: 2019-07-04 | Disposition: A | Discharge status: Home / Self Care | Payer: 59 | Source: Ambulatory Visit

## 2019-07-04 VITALS — BP 136/89 | HR 87 | Temp 97.8°F | Resp 18

## 2019-07-04 DIAGNOSIS — A6920 Lyme disease, unspecified: Secondary | ICD-10-CM

## 2019-07-04 MED ORDER — DOXYCYCLINE HYCLATE 100 MG PO CAPS: 100.0000 mg | ORAL_CAPSULE | Freq: Two times a day (BID) | ORAL | 0 refills | Status: AC

## 2019-07-04 NOTE — ED Triage Notes (Signed)
Patient presents to Urgent Care with complaints of insect bite on her right shoulder blade since 2 weeks ago. Patient reports it is still itchy, approx 2" ring around area of bite. Pt has been applying neosporin.

## 2019-07-04 NOTE — Discharge Instructions (Signed)
  Please take antibiotics as prescribed and be sure to complete entire course even if you start to feel better to ensure infection does not come back.  Follow up with family medicine as needed.

## 2019-07-04 NOTE — ED Provider Notes (Signed)
[facility]    CSN: [id number] Arrival date & time: 07/04/19  3875      History   Chief Complaint Chief Complaint  Patient presents with  . Appointment    0900  . Insect Bite    HPI Deborah Vaughn is a 39 y.o. female.   HPI  Deborah Vaughn is a 38 y.o. female presenting to UC with c/o gradually worsening redness at site of an insect bite she noticed about 2 weeks ago.  Pt initially thought she had a mosquito bite after going to a friend's house for a BBQ outside.  There area is mildly itchy. Denies pain but has noticed a red ring around area of the bite.  Denies fever, chills, n/v/d. She does note her friends have seen ticks in their yard in the past.  Pt never saw a tick but states she scratched off what she thought was a scab the other day.    Past Medical History:  Diagnosis Date  . Hypertension     There are no problems to display for this patient.   History reviewed. No pertinent surgical history.  OB History   No obstetric history on file.      Home Medications    Prior to Admission medications   Medication Sig Start Date End Date Taking? Authorizing Provider  acyclovir (ZOVIRAX) 800 MG tablet Take 800 mg by mouth. PRN   Yes [provider]  hydrochlorothiazide (HYDRODIURIL) 25 MG tablet Take 25 mg by mouth daily.   Yes [provider]  levothyroxine (SYNTHROID) 100 MCG tablet Take 100 mcg by mouth daily before breakfast.   Yes [provider]  linaclotide (LINZESS) 72 MCG capsule Take 72 mcg by mouth daily before breakfast.   Yes [provider]  NON FORMULARY 20 mg. Benzapril   Yes [provider]  doxycycline (VIBRAMYCIN) 100 MG capsule Take 1 capsule (100 mg total) by mouth 2 (two) times daily for 10 days. 07/04/19 07/14/19  Noe Gens, PA-C    Family History Family History  Problem Relation Age of Onset  . Diabetes Mother   . Liver disease Mother   . Cancer Father   . Diabetes Father    . Hypertension Father   . Heart failure Father     Social History Social History   Tobacco Use  . Smoking status: Never Smoker  . Smokeless tobacco: Never Used  Vaping Use  . Vaping Use: Never used  Substance Use Topics  . Alcohol use: Not Currently  . Drug use: Not on file     Allergies   Codeine   Review of Systems Review of Systems  Constitutional: Negative for chills, fatigue and fever.  Gastrointestinal: Negative for diarrhea, nausea and vomiting.  Musculoskeletal: Negative for arthralgias and myalgias.  Skin: Positive for rash.     Physical Exam Triage Vital Signs ED Triage Vitals  Enc Vitals Group     BP 07/04/19 0927 136/89     Pulse Rate 07/04/19 0927 87     Resp 07/04/19 0927 18     Temp 07/04/19 0927 97.8 F (36.6 C)     Temp Source 07/04/19 0927 Oral     SpO2 07/04/19 0927 100 %     Weight --      Height --      Head Circumference --      Peak Flow --      Pain Score 07/04/19 0922 0     Pain Loc --  Pain Edu? --      Excl. in GC? --    No data found.  Updated Vital Signs BP 136/89 (BP Location: Right Arm)   Pulse 87   Temp 97.8 F (36.6 C) (Oral)   Resp 18   SpO2 100%   Visual Acuity Right Eye Distance:   Left Eye Distance:   Bilateral Distance:    Right Eye Near:   Left Eye Near:    Bilateral Near:     Physical Exam Vitals and nursing note reviewed.  Constitutional:      Appearance: Normal appearance. She is well-developed.  HENT:     Head: Normocephalic and atraumatic.  Cardiovascular:     Rate and Rhythm: Normal rate.  Pulmonary:     Effort: Pulmonary effort is normal.  Musculoskeletal:        General: Normal range of motion.     Cervical back: Normal range of motion.  Skin:    General: Skin is warm and dry.     Findings: Erythema and rash present.       Neurological:     Mental Status: She is alert and oriented to person, place, and time.  Psychiatric:        Behavior: Behavior normal.      UC  Treatments / Results  Labs (all labs ordered are listed, but only abnormal results are displayed) Labs Reviewed - No data to display  EKG   Radiology No results found.  Procedures Procedures (including critical care time)  Medications Ordered in UC Medications - No data to display  Initial Impression / Assessment and Plan / UC Course  I have reviewed the triage vital signs and the nursing notes.  Pertinent labs & imaging results that were available during my care of the patient were reviewed by me and considered in my medical decision making (see chart for details).     Rash c/w erythema migrans Will start pt on doxycycline F/u with PCP as needed.  Final Clinical Impressions(s) / UC Diagnoses   Final diagnoses:  Acute Lyme disease with erythema migrans lesion 5 cm or greater in diameter     Discharge Instructions      Please take antibiotics as prescribed and be sure to complete entire course even if you start to feel better to ensure infection does not come back.  Follow up with family medicine as needed.    ED Prescriptions    Medication Sig Dispense Auth. Provider   doxycycline (VIBRAMYCIN) 100 MG capsule Take 1 capsule (100 mg total) by mouth 2 (two) times daily for 10 days. 20 capsule Lurene Shadow, PA-C     PDMP not reviewed this encounter.   Lurene Shadow, [place] 07/04/19 1116

## 2019-07-08 ENCOUNTER — Encounter: Payer: Self-pay | Admitting: Adult Health

## 2019-12-28 ENCOUNTER — Ambulatory Visit: Payer: Self-pay

## 2020-01-02 ENCOUNTER — Telehealth: Payer: Self-pay | Admitting: Emergency Medicine

## 2020-01-02 ENCOUNTER — Emergency Department
Admission: RE | Admit: 2020-01-02 | Discharge: 2020-01-02 | Disposition: A | Discharge status: Home / Self Care | Payer: 59 | Source: Ambulatory Visit

## 2020-01-02 ENCOUNTER — Other Ambulatory Visit: Payer: Self-pay

## 2020-01-02 VITALS — BP 120/85 | HR 92 | Temp 99.0°F | Resp 15 | Ht 64.0 in | Wt 184.0 lb

## 2020-01-02 DIAGNOSIS — J0100 Acute maxillary sinusitis, unspecified: Secondary | ICD-10-CM

## 2020-01-02 DIAGNOSIS — R059 Cough, unspecified: Secondary | ICD-10-CM

## 2020-01-02 DIAGNOSIS — J069 Acute upper respiratory infection, unspecified: Secondary | ICD-10-CM

## 2020-01-02 MED ORDER — BENZONATATE 100 MG PO CAPS: 100.0000 mg | ORAL_CAPSULE | Freq: Three times a day (TID) | ORAL | 0 refills | Status: DC

## 2020-01-02 MED ORDER — AZITHROMYCIN 250 MG PO TABS: 250.0000 mg | ORAL_TABLET | Freq: Every day | ORAL | 0 refills | Status: DC

## 2020-01-02 NOTE — ED Triage Notes (Signed)
Treated for sinus infection last week (Friday) treated w/ bactrim  - cough is worse now , especially at night  COVID vaccine in April - booster scheduled for Sunday  Denies fever or chills C/o body aches yesterday Works from home

## 2020-01-02 NOTE — Discharge Instructions (Signed)

## 2020-01-02 NOTE — ED Provider Notes (Signed)
[name]    CSN: [id number] Arrival date & time: 01/02/20  1016      History   Chief Complaint Chief Complaint  Patient presents with  . Nasal Congestion  . Cough    HPI Deborah Vaughn is a 39 y.o. female.   HPI  Deborah Vaughn is a 39 y.o. female presenting to UC with c/o sinus congestion that was tx with bactrim last Friday, 12/27/19, she still has a few doses left but is coming in today for worsening cough.  Especially at night.  She also notes her teeth are aching now. Denies fever, chills, she did have body aches yesterday.  She had COVID vaccine in April, is scheduled for her booster on Sunday, 12/19.  No known sick contacts.   Past Medical History:  Diagnosis Date  . Breast nodule 05/19/2014  . Breast pain, left 05/19/2014  . Contraceptive management 05/19/2014  . Depression   . Hypertension   . Thyroid disease     Patient Active Problem List   Diagnosis Date Noted  . Thyroid activity decreased 09/25/2015  . Urinary urgency 09/25/2015  . Irregular intermenstrual bleeding 09/25/2015  . Dyspareunia in female 09/25/2015  . Well woman exam with routine gynecological exam 09/25/2015  . Breast pain, left 05/19/2014  . Breast nodule 05/19/2014  . Contraceptive management 05/19/2014    History reviewed. No pertinent surgical history.  OB History   No obstetric history on file.      Home Medications    Prior to Admission medications   Medication Sig Start Date End Date Taking? Authorizing Provider  benazepril (LOTENSIN) 20 MG tablet Take 1 tablet by mouth daily. 01/31/15  Yes [provider]  buPROPion (WELLBUTRIN XL) 300 MG 24 hr tablet 300 mg daily.  04/21/14  Yes [provider]  hydrochlorothiazide (HYDRODIURIL) 25 MG tablet Take 25 mg by mouth daily.   Yes [provider]  linaclotide (LINZESS) 72 MCG capsule Take 72 mcg by mouth daily before breakfast.   Yes [provider]  LORazepam (ATIVAN) 1 MG  tablet Take 1 mg by mouth daily as needed. 12/31/19  Yes [provider]  metFORMIN (GLUCOPHAGE) 500 MG tablet Take 500 mg by mouth daily. 12/09/19  Yes [provider]  norethindrone-ethinyl estradiol (PIRMELLA 7/7/7) 0.5/0.75/1-35 MG-MCG tablet Take 1 tablet by mouth daily. 05/19/14  Yes Cyril Mourning A, NP  acyclovir (ZOVIRAX) 400 MG tablet 400 mg as needed.  04/21/14   [provider]  acyclovir (ZOVIRAX) 800 MG tablet Take 800 mg by mouth. PRN Patient not taking: Reported on 01/02/2020    [provider]  azithromycin (ZITHROMAX) 250 MG tablet Take 1 tablet (250 mg total) by mouth daily. Take first 2 tablets together, then 1 every day until finished. 01/02/20   Lurene Shadow, PA-C  benzonatate (TESSALON) 100 MG capsule Take 1-2 capsules (100-200 mg total) by mouth every 8 (eight) hours. 01/02/20   Lurene Shadow, PA-C  cetirizine (ZYRTEC) 10 MG tablet cetirizine 10 mg tablet    [provider]  hydrochlorothiazide (HYDRODIURIL) 25 MG tablet Take 25 mg by mouth daily. Patient not taking: Reported on 01/02/2020    [provider]  levothyroxine (SYNTHROID) 100 MCG tablet Take 100 mcg by mouth daily before breakfast.    [provider]  levothyroxine (SYNTHROID, LEVOTHROID) 88 MCG tablet take 1 tablet by mouth once daily before BREAKFAST 09/28/15   Adline Potter, NP  Meth-Hyo-M Salley Hews Phos-Ph Sal (URIBEL) 118  MG CAPS Take 1 tid Patient not taking: Reported on 01/02/2020 09/25/15   Adline Potter, NP  NON FORMULARY 20 mg. Benzapril Patient not taking: Reported on 01/02/2020    [provider]    Family History Family History  Problem Relation Age of Onset  . Diabetes Mother   . Liver disease Mother   . Hypertension Mother   . Cancer Father        colon  . Diabetes Father   . Hypertension Father   . Heart failure Father   . Hypertension Sister   . Hypertension Maternal Grandmother   . Hypertension Maternal  Grandfather   . Heart disease Paternal Grandmother   . Diabetes Paternal Grandmother   . Heart disease Paternal Grandfather   . Diabetes Paternal Grandfather     Social History Social History   Tobacco Use  . Smoking status: Never Smoker  . Smokeless tobacco: Never Used  Vaping Use  . Vaping Use: Never used  Substance Use Topics  . Alcohol use: Not Currently  . Drug use: No     Allergies   Codeine   Review of Systems Review of Systems  Constitutional: Negative for chills and fever.  HENT: Positive for congestion, dental problem (upper tooth pain), sinus pressure and sinus pain. Negative for ear pain, sore throat, trouble swallowing and voice change.   Respiratory: Positive for cough. Negative for shortness of breath.   Cardiovascular: Positive for chest pain (soreness with cough). Negative for palpitations.  Gastrointestinal: Negative for abdominal pain, diarrhea, nausea and vomiting.  Musculoskeletal: Negative for arthralgias, back pain and myalgias.  Skin: Negative for rash.  Neurological: Positive for headaches (frontal/sinus). Negative for dizziness and light-headedness.  All other systems reviewed and are negative.    Physical Exam Triage Vital Signs ED Triage Vitals  Enc Vitals Group     BP 01/02/20 1042 120/85     Pulse Rate 01/02/20 1042 92     Resp 01/02/20 1042 15     Temp 01/02/20 1042 99 F (37.2 C)     Temp Source 01/02/20 1042 Oral     SpO2 01/02/20 1042 98 %     Weight 01/02/20 1046 184 lb (83.5 kg)     Height 01/02/20 1046 5\' 4"  (1.626 m)     Head Circumference --      Peak Flow --      Pain Score 01/02/20 1046 2     Pain Loc --      Pain Edu? --      Excl. in GC? --    No data found.  Updated Vital Signs BP 120/85 (BP Location: Right Arm)   Pulse 92   Temp 99 F (37.2 C) (Oral)   Resp 15   Ht 5\' 4"  (1.626 m)   Wt 184 lb (83.5 kg)   SpO2 98%   BMI 31.58 kg/m   Visual Acuity Right Eye Distance:   Left Eye Distance:   Bilateral  Distance:    Right Eye Near:   Left Eye Near:    Bilateral Near:     Physical Exam Vitals and nursing note reviewed.  Constitutional:      General: She is not in acute distress.    Appearance: Normal appearance. She is well-developed and well-nourished. She is not ill-appearing, toxic-appearing or diaphoretic.  HENT:     Head: Normocephalic and atraumatic.     Right Ear: Tympanic membrane and ear canal normal.     Left Ear: Tympanic  membrane and ear canal normal.     Nose:     Right Sinus: Maxillary sinus tenderness present. No frontal sinus tenderness.     Left Sinus: Maxillary sinus tenderness present. No frontal sinus tenderness.     Mouth/Throat:     Lips: Pink.     Mouth: Mucous membranes are moist.     Pharynx: Oropharynx is clear. Uvula midline.  Eyes:     Extraocular Movements: EOM normal.  Cardiovascular:     Rate and Rhythm: Normal rate and regular rhythm.  Pulmonary:     Effort: Pulmonary effort is normal. No respiratory distress.     Breath sounds: Normal breath sounds. No stridor. No wheezing, rhonchi or rales.  Musculoskeletal:        General: Normal range of motion.     Cervical back: Normal range of motion and neck supple. No tenderness.  Lymphadenopathy:     Cervical: No cervical adenopathy.  Skin:    General: Skin is warm and dry.  Neurological:     Mental Status: She is alert and oriented to person, place, and time.  Psychiatric:        Mood and Affect: Mood and affect normal.        Behavior: Behavior normal.      UC Treatments / Results  Labs (all labs ordered are listed, but only abnormal results are displayed) Labs Reviewed  COVID-19, FLU A+B NAA    EKG   Radiology No results found.  Procedures Procedures (including critical care time)  Medications Ordered in UC Medications - No data to display  Initial Impression / Assessment and Plan / UC Course  I have reviewed the triage vital signs and the nursing notes.  Pertinent labs &  imaging results that were available during my care of the patient were reviewed by me and considered in my medical decision making (see chart for details).     COVID/Flu test pending Will add azithromycin for atypical coverage of suspected continued sinus infection and worsening cough F/u with PCP next week as needed AVS given  Final Clinical Impressions(s) / UC Diagnoses   Final diagnoses:  Cough  Acute upper respiratory infection  Acute non-recurrent maxillary sinusitis     Discharge Instructions      Please take antibiotics as prescribed and be sure to complete entire course even if you start to feel better to ensure infection does not come back.  You may take 500mg  acetaminophen every 4-6 hours or in combination with ibuprofen 400-600mg  every 6-8 hours as needed for pain, inflammation, and fever.  Be sure to well hydrated with clear liquids and get at least 8 hours of sleep at night, preferably more while sick.   Please follow up with family medicine in 1 week if needed.     ED Prescriptions    Medication Sig Dispense Auth. Provider   benzonatate (TESSALON) 100 MG capsule Take 1-2 capsules (100-200 mg total) by mouth every 8 (eight) hours. 21 capsule , Lubertha Leite O, PA-C   azithromycin (ZITHROMAX) 250 MG tablet Take 1 tablet (250 mg total) by mouth daily. Take first 2 tablets together, then 1 every day until finished. 6 tablet Doroteo Glassman, PA-C     PDMP not reviewed this encounter.   Lurene Shadow, Lurene Shadow 01/02/20 1749

## 2020-01-02 NOTE — Telephone Encounter (Signed)
Pt treated recently by PCP for a sinus infection - continues to have a cough - appointment made for 6pm - requested pt to come in earlier

## 2020-01-04 LAB — COVID-19, FLU A+B NAA
Influenza A, NAA: NOT DETECTED
Influenza B, NAA: NOT DETECTED
SARS-CoV-2, NAA: NOT DETECTED

## 2020-11-30 ENCOUNTER — Other Ambulatory Visit: Payer: Self-pay

## 2020-11-30 ENCOUNTER — Emergency Department (INDEPENDENT_AMBULATORY_CARE_PROVIDER_SITE_OTHER): Payer: 59

## 2020-11-30 ENCOUNTER — Emergency Department
Admission: RE | Admit: 2020-11-30 | Discharge: 2020-11-30 | Disposition: A | Discharge status: Home / Self Care | Payer: 59 | Source: Ambulatory Visit

## 2020-11-30 VITALS — BP 139/90 | HR 103 | Temp 98.6°F | Resp 17

## 2020-11-30 DIAGNOSIS — M25572 Pain in left ankle and joints of left foot: Secondary | ICD-10-CM | POA: Diagnosis not present

## 2020-11-30 MED ORDER — METHYLPREDNISOLONE 4 MG PO TBPK: ORAL_TABLET | ORAL | 0 refills | Status: DC

## 2020-11-30 MED ORDER — CELECOXIB 100 MG PO CAPS: 100.0000 mg | ORAL_CAPSULE | Freq: Two times a day (BID) | ORAL | 0 refills | Status: AC

## 2020-11-30 NOTE — Discharge Instructions (Addendum)
Advised/instructed patient to take medications as directed with food to completion.  Advised patient to take methylprednisolone with first dose of Celebrex for the next 5-6 of 14 days.  Encouraged patient to increase daily water intake while taking these medications.  Advised patient if symptoms worsen and/or unresolved please follow-up with [facility] orthopedic provider (contact information above on AVS).

## 2020-11-30 NOTE — ED Provider Notes (Signed)
[facility]    CSN: [id number] Arrival date & time: 11/30/20  0846      History   Chief Complaint Chief Complaint  Patient presents with  . Ankle Pain    LT    HPI Deborah Vaughn is a 40 y.o. female.   HPI 40 year old female presents with left ankle pain x 2 months.  Patient denies injury or insult.  Patient reports numbness and tingling, pins-and-needles feeling of left hand as well.  Patient does not recall injury or insult reports doing under the desk elliptical trainer and may have injured it while performing this exercise.  Additionally, patient reports fracturing left ankle 15 years ago.  Past Medical History:  Diagnosis Date  . Breast nodule 05/19/2014  . Breast pain, left 05/19/2014  . Contraceptive management 05/19/2014  . Depression   . Hypertension   . Thyroid disease     Patient Active Problem List   Diagnosis Date Noted  . Thyroid activity decreased 09/25/2015  . Urinary urgency 09/25/2015  . Irregular intermenstrual bleeding 09/25/2015  . Dyspareunia in female 09/25/2015  . Well woman exam with routine gynecological exam 09/25/2015  . Breast pain, left 05/19/2014  . Breast nodule 05/19/2014  . Contraceptive management 05/19/2014    History reviewed. No pertinent surgical history.  OB History   No obstetric history on file.      Home Medications    Prior to Admission medications   Medication Sig Start Date End Date Taking? Authorizing Provider  celecoxib (CELEBREX) 100 MG capsule Take 1 capsule (100 mg total) by mouth 2 (two) times daily for 15 days. 11/30/20 12/15/20 Yes Trevor Iha, FNP  methylPREDNISolone (MEDROL DOSEPAK) 4 MG TBPK tablet Take as directed. 11/30/20  Yes Trevor Iha, FNP  acyclovir (ZOVIRAX) 400 MG tablet 400 mg as needed.  04/21/14   [provider]  acyclovir (ZOVIRAX) 800 MG tablet Take 800 mg by mouth. PRN Patient not taking: Reported on 01/02/2020    [provider]  azithromycin  (ZITHROMAX) 250 MG tablet Take 1 tablet (250 mg total) by mouth daily. Take first 2 tablets together, then 1 every day until finished. 01/02/20   Lurene Shadow, PA-C  benazepril (LOTENSIN) 20 MG tablet Take 1 tablet by mouth daily. 01/31/15   [provider]  benzonatate (TESSALON) 100 MG capsule Take 1-2 capsules (100-200 mg total) by mouth every 8 (eight) hours. 01/02/20   Lurene Shadow, PA-C  buPROPion (WELLBUTRIN XL) 300 MG 24 hr tablet 300 mg daily.  04/21/14   [provider]  cetirizine (ZYRTEC) 10 MG tablet cetirizine 10 mg tablet    [provider]  hydrochlorothiazide (HYDRODIURIL) 25 MG tablet Take 25 mg by mouth daily. Patient not taking: Reported on 01/02/2020    [provider]  hydrochlorothiazide (HYDRODIURIL) 25 MG tablet Take 25 mg by mouth daily.    [provider]  levothyroxine (SYNTHROID) 100 MCG tablet Take 100 mcg by mouth daily before breakfast.    [provider]  levothyroxine (SYNTHROID, LEVOTHROID) 88 MCG tablet take 1 tablet by mouth once daily before BREAKFAST 09/28/15   Adline Potter, NP  linaclotide (LINZESS) 72 MCG capsule Take 72 mcg by mouth daily before breakfast.    [provider]  LORazepam (ATIVAN) 1 MG tablet Take 1 mg by mouth daily as needed. 12/31/19   [provider]  metFORMIN (GLUCOPHAGE) 500 MG tablet Take 500 mg by mouth daily. 12/09/19   [provider]  Meth-Hyo-M  Bl-Na Phos-Ph Sal (URIBEL) 118 MG CAPS Take 1 tid Patient not taking: Reported on 01/02/2020 09/25/15   Adline Potter, NP  NON FORMULARY 20 mg. Benzapril Patient not taking: Reported on 01/02/2020    [provider]  norethindrone-ethinyl estradiol (PIRMELLA 7/7/7) 0.5/0.75/1-35 MG-MCG tablet Take 1 tablet by mouth daily. 05/19/14   Adline Potter, NP    Family History Family History  Problem Relation Age of Onset  . Diabetes Mother   . Liver disease Mother   . Hypertension Mother    . Cancer Father        colon  . Diabetes Father   . Hypertension Father   . Heart failure Father   . Hypertension Sister   . Hypertension Maternal Grandmother   . Hypertension Maternal Grandfather   . Heart disease Paternal Grandmother   . Diabetes Paternal Grandmother   . Heart disease Paternal Grandfather   . Diabetes Paternal Grandfather     Social History Social History   Tobacco Use  . Smoking status: Never  . Smokeless tobacco: Never  Vaping Use  . Vaping Use: Never used  Substance Use Topics  . Alcohol use: Not Currently  . Drug use: No     Allergies   Codeine   Review of Systems Review of Systems  Musculoskeletal:        Left ankle pain x 2 months  All other systems reviewed and are negative.   Physical Exam Triage Vital Signs ED Triage Vitals  Enc Vitals Group     BP 11/30/20 0855 139/90     Pulse Rate 11/30/20 0855 (!) 103     Resp 11/30/20 0855 17     Temp 11/30/20 0855 98.6 F (37 C)     Temp Source 11/30/20 0855 Oral     SpO2 11/30/20 0855 97 %     Weight --      Height --      Head Circumference --      Peak Flow --      Pain Score 11/30/20 0856 8     Pain Loc --      Pain Edu? --      Excl. in GC? --    No data found.  Updated Vital Signs BP 139/90 (BP Location: Right Arm)   Pulse (!) 103   Temp 98.6 F (37 C) (Oral)   Resp 17   LMP  (LMP Unknown)   SpO2 97%       Physical Exam Vitals and nursing note reviewed.  Constitutional:      General: She is not in acute distress.    Appearance: Normal appearance. She is obese. She is not ill-appearing.  HENT:     Head: Normocephalic and atraumatic.     Nose: Nose normal.     Mouth/Throat:     Mouth: Mucous membranes are moist.     Pharynx: Oropharynx is clear.  Eyes:     Extraocular Movements: Extraocular movements intact.     Conjunctiva/sclera: Conjunctivae normal.     Pupils: Pupils are equal, round, and reactive to light.  Cardiovascular:     Pulses: Normal pulses.      Heart sounds: Normal heart sounds.  Pulmonary:     Effort: Pulmonary effort is normal.     Breath sounds: Normal breath sounds.  Musculoskeletal:        General: No deformity. Normal range of motion.     Cervical back: Normal range of motion and neck supple.  Comments: Left ankle: TTP over anterior talofibular ligament with mild soft tissue swelling noted, LROM and mild pain elicited with dorsiflexion/plantar flexion, mild pain elicited with ankle circles, no deformity noted.  Skin:    General: Skin is warm and dry.  Neurological:     General: No focal deficit present.     Mental Status: She is alert and oriented to person, place, and time.     UC Treatments / Results  Labs (all labs ordered are listed, but only abnormal results are displayed) Labs Reviewed - No data to display  EKG   Radiology DG Ankle Complete Left  Result Date: 11/30/2020 CLINICAL DATA:  LEFT ankle pain EXAM: LEFT ANKLE COMPLETE - 3+ VIEW COMPARISON:  None. FINDINGS: Ankle mortise intact. The talar dome is normal. No malleolar fracture. The calcaneus is normal. IMPRESSION: No fracture or dislocation. Electronically Signed   By: Genevive Bi M.D.   On: 11/30/2020 09:42    Procedures Procedures (including critical care time)  Medications Ordered in UC Medications - No data to display  Initial Impression / Assessment and Plan / UC Course  I have reviewed the triage vital signs and the nursing notes.  Pertinent labs & imaging results that were available during my care of the patient were reviewed by me and considered in my medical decision making (see chart for details).    Advised patient if symptoms worsen and/or unresolved please follow-up with [facility] orthopedic provider (contact information above on AVS).  Patient discharged home, hemodynamically stable.  Patient discharged home, hemodynamically stable. Final Clinical Impressions(s) / UC Diagnoses   MDM: 1.  Left ankle pain-left ankle  x-ray revealed no fracture and/or dislocation.  Rx'd Celebrex and Medrol Dosepak.  Advised patient if symptoms worsen and/or unresolved please follow-up with [facility] orthopedic provider (contact information above on AVS).  Patient discharged home, hemodynamically stable.  Patient discharged home, hemodynamically stable. Final Clinical Impressions(s) / UC Diagnoses   Final diagnoses:  Acute left ankle pain     Discharge Instructions      Advised/instructed patient to take medications as directed with food to completion.  Advised patient to take methylprednisolone with first dose of Celebrex for the next 5-6 of 14 days.  Encouraged patient to increase daily water intake while taking these medications.  Advised patient if symptoms worsen and/or unresolved please follow-up with [facility] orthopedic provider (contact information above on AVS).        ED Prescriptions     Medication Sig Dispense Auth. Provider   celecoxib (CELEBREX) 100 MG capsule Take 1 capsule (100 mg total) by mouth 2 (two) times daily for 15 days. 30 capsule Trevor Iha, FNP   methylPREDNISolone (MEDROL DOSEPAK) 4 MG TBPK tablet Take as directed. 1 each Trevor Iha, FNP      PDMP not reviewed this encounter.   Trevor Iha, FNP 11/30/20 1013

## 2020-11-30 NOTE — ED Triage Notes (Signed)
Pt c/o LT ankle pain and swelling x 2 months. Denies injury. Also states she sometimes have numbness in LT leg and a pins and needles type feeling in her LT hand as well. Pain 8/10 Compression a few times a week prn.

## 2021-01-27 ENCOUNTER — Emergency Department
Admission: RE | Admit: 2021-01-27 | Discharge: 2021-01-27 | Disposition: A | Discharge status: Home / Self Care | Payer: 59 | Source: Ambulatory Visit | Attending: Family Medicine | Admitting: Family Medicine

## 2021-01-27 ENCOUNTER — Other Ambulatory Visit: Payer: Self-pay

## 2021-01-27 ENCOUNTER — Emergency Department (INDEPENDENT_AMBULATORY_CARE_PROVIDER_SITE_OTHER): Payer: 59

## 2021-01-27 VITALS — BP 133/93 | HR 97 | Temp 98.9°F | Resp 16 | Ht 64.0 in | Wt 192.0 lb

## 2021-01-27 DIAGNOSIS — R1013 Epigastric pain: Secondary | ICD-10-CM | POA: Diagnosis not present

## 2021-01-27 DIAGNOSIS — K5900 Constipation, unspecified: Secondary | ICD-10-CM | POA: Diagnosis not present

## 2021-01-27 DIAGNOSIS — R109 Unspecified abdominal pain: Secondary | ICD-10-CM | POA: Diagnosis not present

## 2021-01-27 MED ORDER — LIDOCAINE VISCOUS HCL 2 % MT SOLN
15.0000 mL | Freq: Once | OROMUCOSAL | Status: AC
  Administered 2021-01-27: 15 mL via ORAL

## 2021-01-27 MED ORDER — OMEPRAZOLE 40 MG PO CPDR: 40.0000 mg | DELAYED_RELEASE_CAPSULE | Freq: Every day | ORAL | 0 refills | Status: AC

## 2021-01-27 MED ORDER — SUCRALFATE 1 G PO TABS: 1.0000 g | ORAL_TABLET | Freq: Three times a day (TID) | ORAL | 0 refills | Status: AC

## 2021-01-27 MED ORDER — ALUM & MAG HYDROXIDE-SIMETH 200-200-20 MG/5ML PO SUSP
30.0000 mL | Freq: Once | ORAL | Status: AC
  Administered 2021-01-27: 30 mL via ORAL

## 2021-01-27 NOTE — ED Triage Notes (Signed)
Heartburn x 9 days  OTC - taking pepcid AC  No relief w/ pepto bismol, omeprazole & tums  Constipation since Friday  Milk of magnesia today - loose BM

## 2021-01-27 NOTE — Discharge Instructions (Signed)
Call your doctor for follow-up in the next couple of weeks Take omeprazole 40 mg a day on an empty stomach Take Carafate 4 times a day as directed Stay with bland diet until stomach pain improves, no spicy foods or fried foods Continue daily Linzess as prescribed

## 2021-01-27 NOTE — ED Provider Notes (Signed)
[name]    CSN: [id number] Arrival date & time: 01/27/21  1134      History   Chief Complaint Chief Complaint  Patient presents with   Heartburn    HPI Deborah Vaughn is a 41 y.o. female.   HPI  Patient states she has longstanding constipation.  She takes Linzess for this.  This works for her quite well.  Currently she states that she feels constipated and took a dose of milk of magnesia, and had a loose bowel movement.  Still has a full sensation in her abdomen.  Epigastric pain.  Heartburn. Has tried Pepcid AC, omeprazole, Pepto-Bismol, and Tums.  None of these have given her significant relief Non-smoker No alcohol No recent use of NSAID or aspirin   Past Medical History:  Diagnosis Date   Breast nodule 05/19/2014   Breast pain, left 05/19/2014   Contraceptive management 05/19/2014   Depression    Hypertension    Thyroid disease     Patient Active Problem List   Diagnosis Date Noted   Thyroid activity decreased 09/25/2015   Urinary urgency 09/25/2015   Irregular intermenstrual bleeding 09/25/2015   Dyspareunia in female 09/25/2015   Well woman exam with routine gynecological exam 09/25/2015   Breast pain, left 05/19/2014   Breast nodule 05/19/2014   Contraceptive management 05/19/2014    History reviewed. No pertinent surgical history.  OB History   No obstetric history on file.      Home Medications    Prior to Admission medications   Medication Sig Start Date End Date Taking? Authorizing Provider  omeprazole (PRILOSEC) 40 MG capsule Take 1 capsule (40 mg total) by mouth daily. 01/27/21  Yes Eustace Moore, MD  sucralfate (CARAFATE) 1 g tablet Take 1 tablet (1 g total) by mouth 4 (four) times daily -  with meals and at bedtime. 01/27/21  Yes Eustace Moore, MD  benazepril (LOTENSIN) 20 MG tablet Take 1 tablet by mouth daily. 01/31/15   [provider]  buPROPion (WELLBUTRIN XL) 300 MG 24 hr tablet 300 mg daily.  04/21/14    [provider]  hydrochlorothiazide (HYDRODIURIL) 25 MG tablet Take 25 mg by mouth daily.    [provider]  levothyroxine (SYNTHROID) 100 MCG tablet Take 100 mcg by mouth daily before breakfast.    [provider]  linaclotide (LINZESS) 72 MCG capsule Take 72 mcg by mouth daily before breakfast.    [provider]  LORazepam (ATIVAN) 1 MG tablet Take 1 mg by mouth daily as needed. 12/31/19   [provider]  metFORMIN (GLUCOPHAGE) 500 MG tablet Take 500 mg by mouth daily. 12/09/19   [provider]  norethindrone-ethinyl estradiol (PIRMELLA 7/7/7) 0.5/0.75/1-35 MG-MCG tablet Take 1 tablet by mouth daily. 05/19/14   Adline Potter, NP  valACYclovir (VALTREX) 500 MG tablet valacyclovir 500 mg tablet  TAKE 1 TABLET BY MOUTH EVERY DAY    [provider]    Family History Family History  Problem Relation Age of Onset   Diabetes Mother    Liver disease Mother    Hypertension Mother    Cancer Father        colon   Diabetes Father    Hypertension Father    Heart failure Father    Hypertension Sister    Hypertension Maternal Grandmother    Hypertension Maternal Grandfather    Heart disease Paternal Grandmother    Diabetes Paternal Grandmother    Heart disease Paternal Grandfather  Diabetes Paternal Grandfather     Social History Social History   Tobacco Use   Smoking status: Never    Passive exposure: Never   Smokeless tobacco: Never  Vaping Use   Vaping Use: Never used  Substance Use Topics   Alcohol use: Not Currently   Drug use: No     Allergies   Codeine   Review of Systems Review of Systems See HPI  Physical Exam Triage Vital Signs ED Triage Vitals  Enc Vitals Group     BP 01/27/21 1215 (!) 133/93     Pulse Rate 01/27/21 1215 97     Resp 01/27/21 1215 16     Temp 01/27/21 1215 98.9 F (37.2 C)     Temp Source 01/27/21 1215 Oral     SpO2 01/27/21 1215 99 %     Weight 01/27/21 1218 192 lb  (87.1 kg)     Height 01/27/21 1218 5\' 4"  (1.626 m)     Head Circumference --      Peak Flow --      Pain Score 01/27/21 1218 0     Pain Loc --      Pain Edu? --      Excl. in GC? --    No data found.  Updated Vital Signs BP (!) 133/93 (BP Location: Left Arm)    Pulse 97    Temp 98.9 F (37.2 C) (Oral)    Resp 16    Ht 5\' 4"  (1.626 m)    Wt 87.1 kg    LMP 01/11/2021 (Approximate)    SpO2 99%    BMI 32.96 kg/m     Physical Exam Constitutional:      General: She is not in acute distress.    Appearance: Normal appearance. She is well-developed.  HENT:     Head: Normocephalic and atraumatic.  Eyes:     Conjunctiva/sclera: Conjunctivae normal.     Pupils: Pupils are equal, round, and reactive to light.  Cardiovascular:     Rate and Rhythm: Normal rate and regular rhythm.     Heart sounds: Normal heart sounds.  Pulmonary:     Effort: Pulmonary effort is normal. No respiratory distress.  Abdominal:     General: There is no distension.     Palpations: Abdomen is soft.     Tenderness: There is abdominal tenderness. There is no guarding or rebound.     Comments: Patient has acute epigastric tenderness.  No organomegaly.  No mass  Musculoskeletal:        General: Normal range of motion.     Cervical back: Normal range of motion.  Skin:    General: Skin is warm and dry.  Neurological:     Mental Status: She is alert.  Psychiatric:        Mood and Affect: Mood normal.        Behavior: Behavior normal.     UC Treatments / Results  Labs (all labs ordered are listed, but only abnormal results are displayed) Labs Reviewed - No data to display  EKG   Radiology DG Abdomen 1 View  Result Date: 01/27/2021 CLINICAL DATA:  Abdominal pain, constipation EXAM: ABDOMEN - 1 VIEW COMPARISON:  CT done on 02/11/2003. FINDINGS: Bowel gas pattern is nonspecific. Small amount of stool is seen in colon. There is no fecal impaction in the rectosigmoid. No abnormal masses or calcifications are  seen. Kidneys are partly obscured by bowel contents limiting evaluation for small renal stones. There is  1.5 cm cylindrical density in the right side of pelvis, possibly suggesting oral medication in the GI tract. Visualized lower lung fields are clear IMPRESSION: Nonspecific bowel gas pattern. Small amount of stool is seen in colon without signs of fecal impaction in the rectosigmoid. Kidneys are partly obscured by bowel contents limiting evaluation for small renal stones. There is 15 mm cylindrical density in the right side of pelvis suggesting possible oral medication in the GI tract. Electronically Signed   By: Ernie Avena M.D.   On: 01/27/2021 13:22    Procedures Procedures (including critical care time)  Medications Ordered in UC Medications  alum & mag hydroxide-simeth (MAALOX/MYLANTA) 200-200-20 MG/5ML suspension 30 mL (30 mLs Oral Given 01/27/21 1301)    And  lidocaine (XYLOCAINE) 2 % viscous mouth solution 15 mL (15 mLs Oral Given 01/27/21 1301)    Initial Impression / Assessment and Plan / UC Course  I have reviewed the triage vital signs and the nursing notes.  Pertinent labs & imaging results that were available during my care of the patient were reviewed by me and considered in my medical decision making (see chart for details).     Patient had improvement of her lower chest and epigastric pain with the GI cocktail. We discussed treatment of heartburn and excess acid.  Possible additional evaluation.  Possible H. pylori testing. Follow-up with primary care Final Clinical Impressions(s) / UC Diagnoses   Final diagnoses:  Abdominal pain, epigastric  Dyspepsia     Discharge Instructions      Call your doctor for follow-up in the next couple of weeks Take omeprazole 40 mg a day on an empty stomach Take Carafate 4 times a day as directed Stay with bland diet until stomach pain improves, no spicy foods or fried foods Continue daily Linzess as prescribed   ED  Prescriptions     Medication Sig Dispense Auth. Provider   omeprazole (PRILOSEC) 40 MG capsule Take 1 capsule (40 mg total) by mouth daily. 30 capsule Eustace Moore, MD   sucralfate (CARAFATE) 1 g tablet Take 1 tablet (1 g total) by mouth 4 (four) times daily -  with meals and at bedtime. 120 tablet Eustace Moore, MD      PDMP not reviewed this encounter.   Eustace Moore, MD 01/27/21 1438

## 2021-02-01 ENCOUNTER — Encounter: Payer: Self-pay | Admitting: Internal Medicine

## 2021-05-27 ENCOUNTER — Ambulatory Visit: Payer: 59 | Admitting: Gastroenterology

## 2022-06-16 IMAGING — DX DG ABDOMEN 1V
3 series · 3 of 3 positions shown · non-contrast
Comparison: CT done on 02/11/2003.

CLINICAL DATA: Abdominal pain, constipation

EXAM:
ABDOMEN - 1 VIEW

[abdomen kub (1 of 3)]
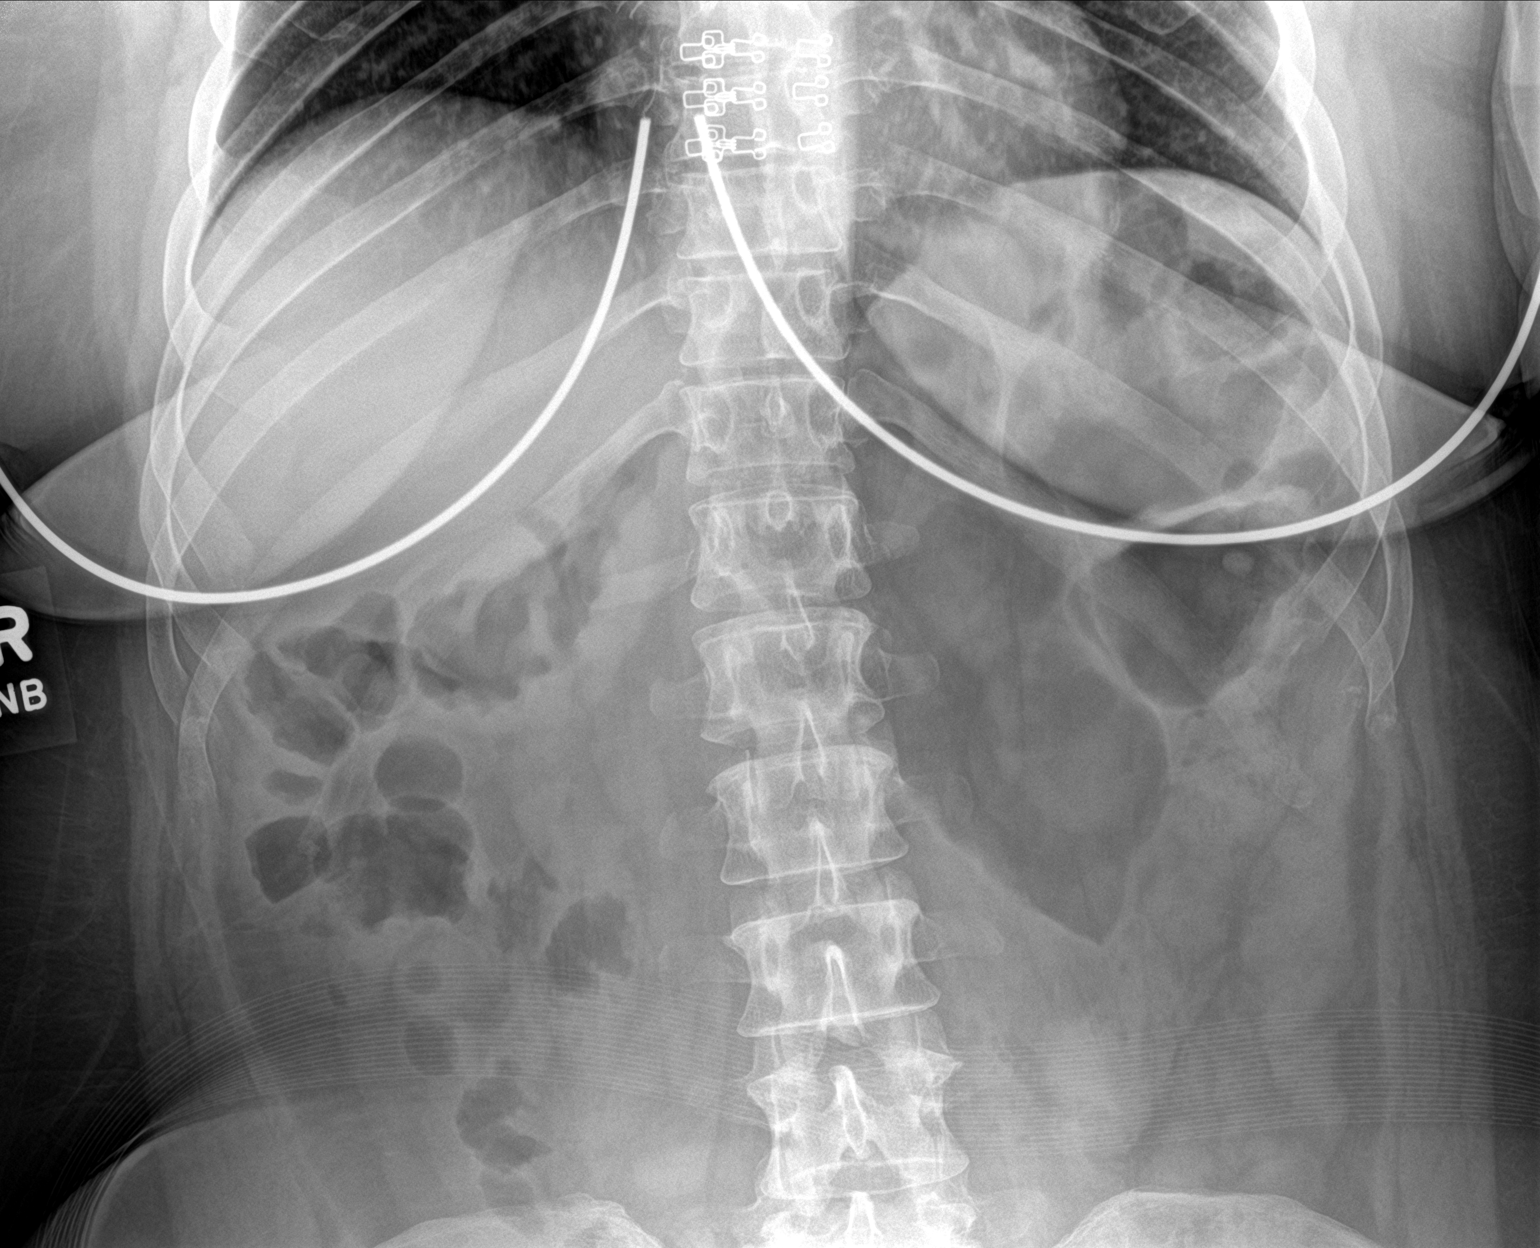

[abdomen kub (2 of 3)]
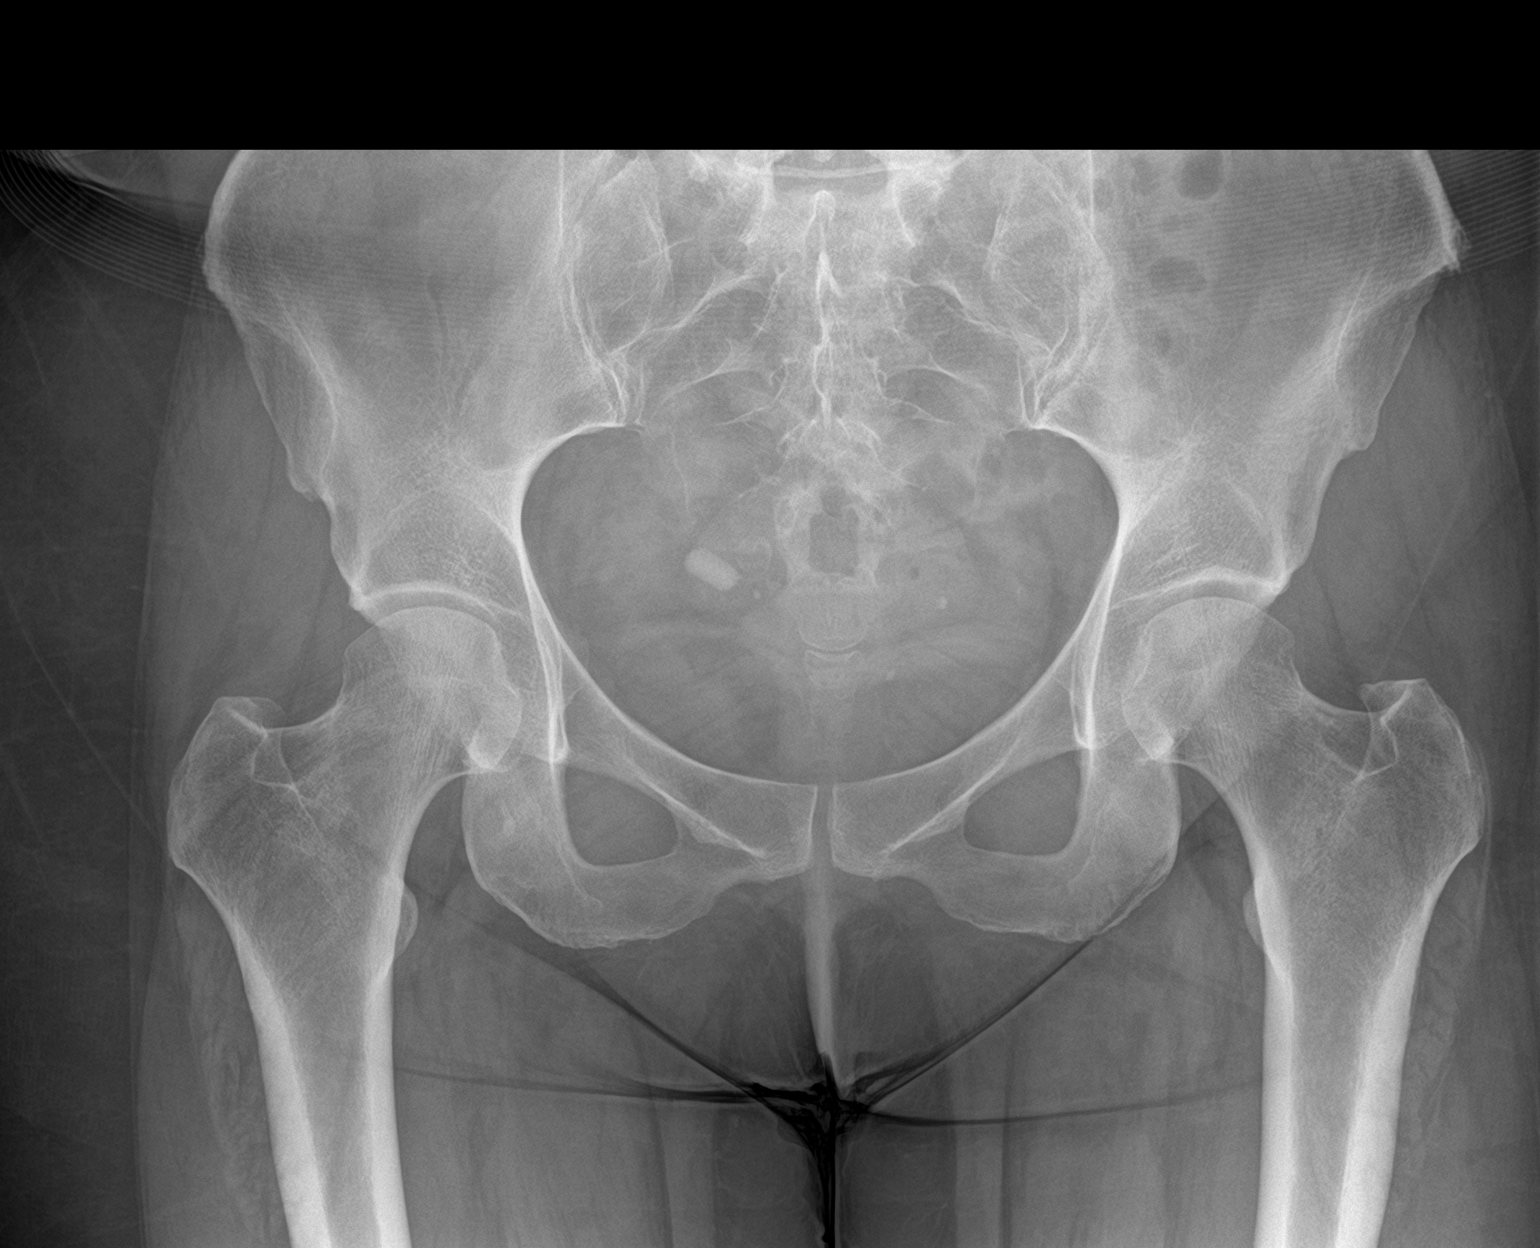

[abdomen kub (3 of 3)]
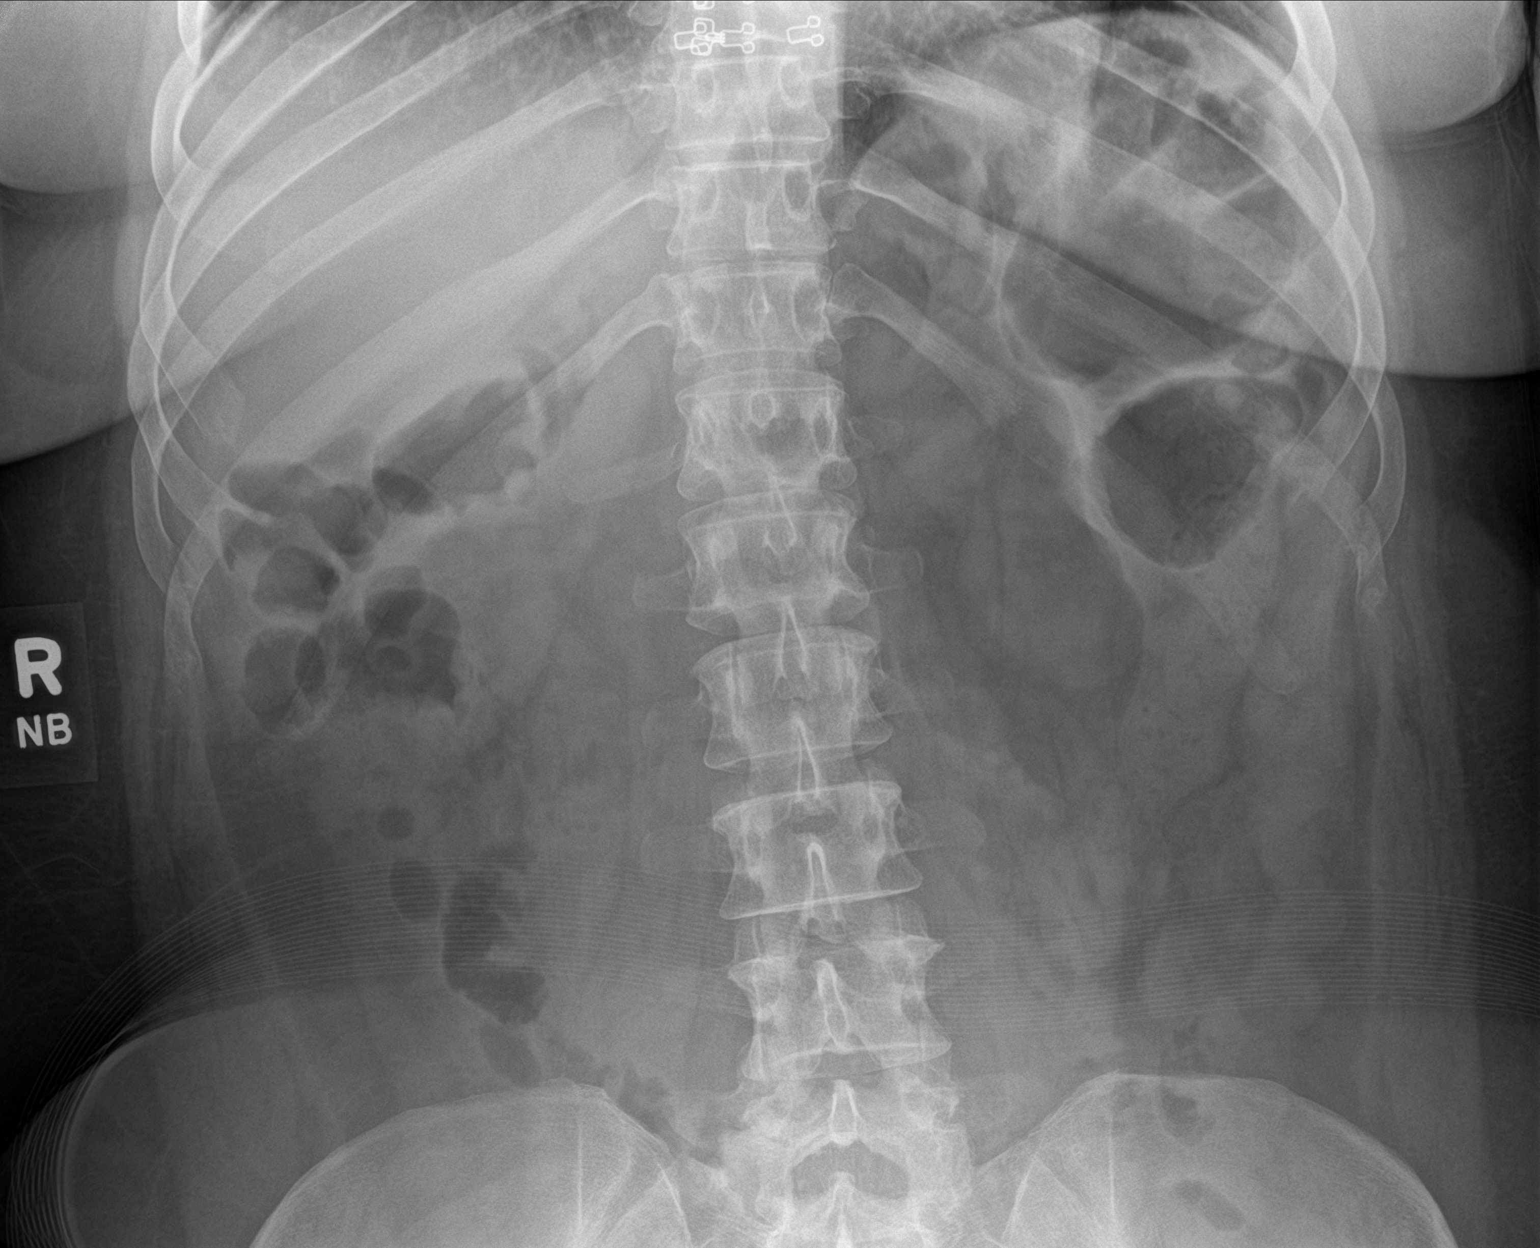

[3 of 3 positions shown; findings below may reference images not displayed]

FINDINGS: Bowel gas pattern is nonspecific. Small amount of stool is seen in
colon. There is no fecal impaction in the rectosigmoid. No abnormal
masses or calcifications are seen. Kidneys are partly obscured by
bowel contents limiting evaluation for small renal stones. There is
1.5 cm cylindrical density in the right side of pelvis, possibly
suggesting oral medication in the GI tract. Visualized lower lung
fields are clear
IMPRESSION: Nonspecific bowel gas pattern. Small amount of stool is seen in
colon without signs of fecal impaction in the rectosigmoid.

Kidneys are partly obscured by bowel contents limiting evaluation
for small renal stones. There is 15 mm cylindrical density in the
right side of pelvis suggesting possible oral medication in the GI
tract.

## 2023-04-26 ENCOUNTER — Emergency Department (HOSPITAL_BASED_OUTPATIENT_CLINIC_OR_DEPARTMENT_OTHER)

## 2023-04-26 ENCOUNTER — Encounter (HOSPITAL_COMMUNITY): Payer: Self-pay

## 2023-04-26 ENCOUNTER — Emergency Department (HOSPITAL_COMMUNITY)
Admission: EM | Admit: 2023-04-26 | Discharge: 2023-04-26 | Disposition: A | Discharge status: Home / Self Care | Attending: Emergency Medicine | Admitting: Emergency Medicine

## 2023-04-26 ENCOUNTER — Other Ambulatory Visit: Payer: Self-pay

## 2023-04-26 DIAGNOSIS — M7989 Other specified soft tissue disorders: Secondary | ICD-10-CM

## 2023-04-26 DIAGNOSIS — R2242 Localized swelling, mass and lump, left lower limb: Secondary | ICD-10-CM | POA: Insufficient documentation

## 2023-04-26 MED ORDER — DICLOFENAC SODIUM 75 MG PO TBEC: 75.0000 mg | DELAYED_RELEASE_TABLET | Freq: Two times a day (BID) | ORAL | 0 refills | Status: AC

## 2023-04-26 NOTE — Discharge Instructions (Addendum)
 Return if any problems.

## 2023-04-26 NOTE — ED Notes (Signed)
 Pt undressed from waist down and in a gown

## 2023-04-26 NOTE — ED Provider Notes (Signed)
 [place] EMERGENCY DEPARTMENT AT [facility] Provider Note   CSN: [id number] Arrival date & time: 04/26/23  1302     History  Chief Complaint  Patient presents with   Leg Swelling    Deborah Vaughn is a 43 y.o. female.  Patient complains of swelling and discomfort in her left lower leg.  Patient was seen and evaluated and sent here for evaluation of the possible DVT.  Patient reports that she has had a similar episode of leg swelling in the past and was evaluated and did not have a DVT.  Patient states that she has discomfort in her calf and swelling in her calf muscle.  Patient denies any fever or chills.  She denies any redness.  Patient states that she has not injured her leg.  Patient has not had any change in her diet. She denies any prolonged sitting.  The history is provided by the patient. No language interpreter was used.       Home Medications Prior to Admission medications   Medication Sig Start Date End Date Taking? Authorizing Provider  diclofenac (VOLTAREN) 75 MG EC tablet Take 1 tablet (75 mg total) by mouth 2 (two) times daily. 04/26/23  Yes Cheron Schaumann K, PA-C  benazepril (LOTENSIN) 20 MG tablet Take 1 tablet by mouth daily. 01/31/15   [provider]  buPROPion (WELLBUTRIN XL) 300 MG 24 hr tablet 300 mg daily.  04/21/14   [provider]  hydrochlorothiazide (HYDRODIURIL) 25 MG tablet Take 25 mg by mouth daily.    [provider]  levothyroxine (SYNTHROID) 100 MCG tablet Take 100 mcg by mouth daily before breakfast.    [provider]  linaclotide (LINZESS) 72 MCG capsule Take 72 mcg by mouth daily before breakfast.    [provider]  LORazepam (ATIVAN) 1 MG tablet Take 1 mg by mouth daily as needed. 12/31/19   [provider]  metFORMIN (GLUCOPHAGE) 500 MG tablet Take 500 mg by mouth daily. 12/09/19   [provider]  norethindrone-ethinyl estradiol (PIRMELLA 7/7/7) 0.5/0.75/1-35 MG-MCG  tablet Take 1 tablet by mouth daily. 05/19/14   Adline Potter, NP  omeprazole (PRILOSEC) 40 MG capsule Take 1 capsule (40 mg total) by mouth daily. 01/27/21   Eustace Moore, MD  sucralfate (CARAFATE) 1 g tablet Take 1 tablet (1 g total) by mouth 4 (four) times daily -  with meals and at bedtime. 01/27/21   Eustace Moore, MD  valACYclovir (VALTREX) 500 MG tablet valacyclovir 500 mg tablet  TAKE 1 TABLET BY MOUTH EVERY DAY    [provider]      Allergies    Codeine    Review of Systems   Review of Systems  All other systems reviewed and are negative.   Physical Exam Updated Vital Signs BP (!) 154/99 (BP Location: Right Arm)   Pulse 94   Temp 98.5 F (36.9 C) (Oral)   Resp 18   Ht 5\' 4"  (1.626 m)   Wt 87.1 kg   SpO2 98%   BMI 32.96 kg/m  Physical Exam Vitals and nursing note reviewed.  Constitutional:      Appearance: She is well-developed.  HENT:     Head: Normocephalic.  Cardiovascular:     Rate and Rhythm: Normal rate.  Pulmonary:     Effort: Pulmonary effort is normal.  Abdominal:     General: There is no distension.  Musculoskeletal:        General: Swelling and  tenderness present.     Comments: Swollen tender left lower leg full range of motion knee and ankle, positive Homans to pain.  Neurovascular neurosensory intact  Skin:    General: Skin is warm.  Neurological:     General: No focal deficit present.     Mental Status: She is alert and oriented to person, place, and time.     ED Results / Procedures / Treatments   Labs (all labs ordered are listed, but only abnormal results are displayed) Labs Reviewed - No data to display  EKG None  Radiology VAS [place] LOWER EXTREMITY VENOUS (DVT) (ONLY [facility] & [facility]) Result Date: 04/26/2023  Lower Venous DVT Study Patient Name:  Deborah Vaughn  Date of Exam:   04/26/2023 Medical Rec #: [id number]          Accession #:    [id number] Date of Birth: 09/26/1980           Patient Gender: F Patient Age:   44  years Exam Location:  [facility] Procedure:      VAS [place] LOWER EXTREMITY VENOUS (DVT) Referring Phys: Gedeon Brandow --------------------------------------------------------------------------------  Indications: Swelling, and Edema.  Risk Factors: Past pregnancy and obesity. Performing Technologist: Shona Simpson  Examination Guidelines: A complete evaluation includes B-mode imaging, spectral Doppler, color Doppler, and power Doppler as needed of all accessible portions of each vessel. Bilateral testing is considered an integral part of a complete examination. Limited examinations for reoccurring indications may be performed as noted. The reflux portion of the exam is performed with the patient in reverse Trendelenburg.  +-----+---------------+---------+-----------+----------+--------------+ RIGHTCompressibilityPhasicitySpontaneityPropertiesThrombus Aging +-----+---------------+---------+-----------+----------+--------------+ CFV  Full           Yes      Yes                                 +-----+---------------+---------+-----------+----------+--------------+   +---------+---------------+---------+-----------+----------+--------------+ LEFT     CompressibilityPhasicitySpontaneityPropertiesThrombus Aging +---------+---------------+---------+-----------+----------+--------------+ CFV      Full           Yes      Yes                                 +---------+---------------+---------+-----------+----------+--------------+ SFJ      Full                                                        +---------+---------------+---------+-----------+----------+--------------+ FV Prox  Full                                                        +---------+---------------+---------+-----------+----------+--------------+ FV Mid   Full                                                        +---------+---------------+---------+-----------+----------+--------------+ FV  DistalFull                                                        +---------+---------------+---------+-----------+----------+--------------+  PFV      Full                                                        +---------+---------------+---------+-----------+----------+--------------+ POP      Full           Yes      Yes                                 +---------+---------------+---------+-----------+----------+--------------+ PTV      Full                                                        +---------+---------------+---------+-----------+----------+--------------+ PERO     Full                                                        +---------+---------------+---------+-----------+----------+--------------+     Summary: RIGHT: - No evidence of common femoral vein obstruction.   LEFT: - There is no evidence of deep vein thrombosis in the lower extremity.  - No cystic structure found in the popliteal fossa.  *See table(s) above for measurements and observations.    Preliminary     Procedures Procedures    Medications Ordered in ED Medications - No data to display  ED Course/ Medical Decision Making/ A&P                                 Medical Decision Making Patient complains of pain in her left lower leg.  Patient was seen in urgent care before here and sent here for ultrasound  Amount and/or Complexity of Data Reviewed Radiology: ordered and independent interpretation performed. Decision-making details documented in ED Course.    Details: Venous ultrasound left lower extremity shows no evidence of DVT.  Risk Prescription drug management. Risk Details: Patient given a prescription for Voltaren patient is advised to try compression hose elevate.  Return if any problems.           Final Clinical Impression(s) / ED Diagnoses Final diagnoses:  Left leg swelling    Rx / DC Orders ED Discharge Orders          Ordered    diclofenac (VOLTAREN)  75 MG EC tablet  2 times daily        04/26/23 1507           An After Visit Summary was printed and given to the patient.    Elson Areas, PA-C 04/26/23 1541    Elayne Snare K, DO 04/26/23 1545

## 2023-04-26 NOTE — Progress Notes (Signed)
 Lower extremity venous duplex completed. Please see CV Procedures for preliminary results.  Shona Simpson, RVT 04/26/23 2:27 PM

## 2023-04-26 NOTE — ED Triage Notes (Signed)
 Left lower leg swelling that started 3 days ago with redness and is warm to touch. UC sent pt for [place] of leg. No blood thinners.

## 2023-07-26 ENCOUNTER — Ambulatory Visit
